# Patient Record
Sex: Female | Born: 1965 | Race: Black or African American | Hispanic: No | Smoking: Never smoker
Health system: Southern US, Community
[De-identification: ages and names within clinical notes are randomized; demographics above are authoritative.]

## PROBLEM LIST (undated history)

## (undated) DIAGNOSIS — R7303 Prediabetes: Secondary | ICD-10-CM

## (undated) DIAGNOSIS — M1990 Unspecified osteoarthritis, unspecified site: Secondary | ICD-10-CM

## (undated) DIAGNOSIS — I10 Essential (primary) hypertension: Secondary | ICD-10-CM

## (undated) HISTORY — PX: AUGMENTATION MAMMAPLASTY: SUR837

---

## 2004-07-31 ENCOUNTER — Emergency Department (HOSPITAL_COMMUNITY): Admission: EM | Admit: 2004-07-31 | Discharge: 2004-07-31 | Payer: Self-pay | Admitting: Emergency Medicine

## 2010-02-06 ENCOUNTER — Emergency Department (HOSPITAL_COMMUNITY): Admission: EM | Admit: 2010-02-06 | Discharge: 2010-02-06 | Payer: Self-pay | Admitting: Emergency Medicine

## 2010-05-19 ENCOUNTER — Other Ambulatory Visit: Payer: Self-pay | Admitting: Obstetrics and Gynecology

## 2010-05-19 DIAGNOSIS — Z139 Encounter for screening, unspecified: Secondary | ICD-10-CM

## 2010-06-16 ENCOUNTER — Ambulatory Visit (HOSPITAL_BASED_OUTPATIENT_CLINIC_OR_DEPARTMENT_OTHER)
Admission: RE | Admit: 2010-06-16 | Discharge: 2010-06-16 | Disposition: A | Discharge status: Home / Self Care | Payer: 59 | Source: Ambulatory Visit | Attending: Obstetrics and Gynecology | Admitting: Obstetrics and Gynecology

## 2010-06-16 DIAGNOSIS — Z139 Encounter for screening, unspecified: Secondary | ICD-10-CM

## 2010-06-16 DIAGNOSIS — Z1231 Encounter for screening mammogram for malignant neoplasm of breast: Secondary | ICD-10-CM | POA: Insufficient documentation

## 2011-06-08 ENCOUNTER — Other Ambulatory Visit: Payer: Self-pay | Admitting: Obstetrics and Gynecology

## 2011-06-08 DIAGNOSIS — Z1231 Encounter for screening mammogram for malignant neoplasm of breast: Secondary | ICD-10-CM

## 2011-06-22 ENCOUNTER — Ambulatory Visit: Payer: 59

## 2011-07-18 ENCOUNTER — Ambulatory Visit: Payer: 59

## 2012-02-14 ENCOUNTER — Other Ambulatory Visit: Payer: Self-pay | Admitting: Family Medicine

## 2012-02-14 NOTE — Telephone Encounter (Signed)
Chart pulled to PA pool at nurses station DOS 03/02/11

## 2012-02-21 ENCOUNTER — Other Ambulatory Visit: Payer: Self-pay | Admitting: *Deleted

## 2012-02-24 ENCOUNTER — Other Ambulatory Visit: Payer: Self-pay | Admitting: *Deleted

## 2012-02-27 ENCOUNTER — Other Ambulatory Visit: Payer: Self-pay | Admitting: Radiology

## 2012-06-25 ENCOUNTER — Ambulatory Visit (HOSPITAL_BASED_OUTPATIENT_CLINIC_OR_DEPARTMENT_OTHER): Payer: 59

## 2012-07-02 ENCOUNTER — Ambulatory Visit (HOSPITAL_BASED_OUTPATIENT_CLINIC_OR_DEPARTMENT_OTHER)
Admission: RE | Admit: 2012-07-02 | Discharge: 2012-07-02 | Disposition: A | Discharge status: Home / Self Care | Payer: 59 | Source: Ambulatory Visit | Attending: Obstetrics and Gynecology | Admitting: Obstetrics and Gynecology

## 2012-07-02 DIAGNOSIS — Z1231 Encounter for screening mammogram for malignant neoplasm of breast: Secondary | ICD-10-CM | POA: Insufficient documentation

## 2013-11-13 ENCOUNTER — Other Ambulatory Visit: Payer: Self-pay | Admitting: Physician Assistant

## 2013-11-13 ENCOUNTER — Other Ambulatory Visit (HOSPITAL_BASED_OUTPATIENT_CLINIC_OR_DEPARTMENT_OTHER): Payer: Self-pay | Admitting: Internal Medicine

## 2013-11-13 DIAGNOSIS — Z1231 Encounter for screening mammogram for malignant neoplasm of breast: Secondary | ICD-10-CM

## 2013-11-14 ENCOUNTER — Emergency Department (HOSPITAL_COMMUNITY)
Admission: EM | Admit: 2013-11-14 | Discharge: 2013-11-14 | Disposition: A | Discharge status: Home / Self Care | Payer: 59 | Attending: Emergency Medicine | Admitting: Emergency Medicine

## 2013-11-14 ENCOUNTER — Emergency Department (HOSPITAL_COMMUNITY): Payer: 59

## 2013-11-14 ENCOUNTER — Encounter (HOSPITAL_COMMUNITY): Payer: Self-pay | Admitting: Emergency Medicine

## 2013-11-14 DIAGNOSIS — Z8739 Personal history of other diseases of the musculoskeletal system and connective tissue: Secondary | ICD-10-CM | POA: Diagnosis not present

## 2013-11-14 DIAGNOSIS — S60229A Contusion of unspecified hand, initial encounter: Secondary | ICD-10-CM | POA: Diagnosis not present

## 2013-11-14 DIAGNOSIS — S8000XA Contusion of unspecified knee, initial encounter: Secondary | ICD-10-CM | POA: Diagnosis not present

## 2013-11-14 DIAGNOSIS — I498 Other specified cardiac arrhythmias: Secondary | ICD-10-CM | POA: Diagnosis not present

## 2013-11-14 DIAGNOSIS — S8990XA Unspecified injury of unspecified lower leg, initial encounter: Secondary | ICD-10-CM | POA: Diagnosis present

## 2013-11-14 DIAGNOSIS — S8001XA Contusion of right knee, initial encounter: Secondary | ICD-10-CM

## 2013-11-14 DIAGNOSIS — S99929A Unspecified injury of unspecified foot, initial encounter: Secondary | ICD-10-CM

## 2013-11-14 DIAGNOSIS — Y9389 Activity, other specified: Secondary | ICD-10-CM | POA: Diagnosis not present

## 2013-11-14 DIAGNOSIS — S60221A Contusion of right hand, initial encounter: Secondary | ICD-10-CM

## 2013-11-14 DIAGNOSIS — Y929 Unspecified place or not applicable: Secondary | ICD-10-CM | POA: Diagnosis not present

## 2013-11-14 DIAGNOSIS — S99919A Unspecified injury of unspecified ankle, initial encounter: Secondary | ICD-10-CM | POA: Diagnosis present

## 2013-11-14 DIAGNOSIS — S8002XA Contusion of left knee, initial encounter: Secondary | ICD-10-CM

## 2013-11-14 HISTORY — DX: Unspecified osteoarthritis, unspecified site: M19.90

## 2013-11-14 MED ORDER — TRAMADOL HCL 50 MG PO TABS: 50.0000 mg | ORAL_TABLET | Freq: Four times a day (QID) | ORAL | Status: DC | PRN

## 2013-11-14 MED ORDER — IBUPROFEN 800 MG PO TABS
800.0000 mg | ORAL_TABLET | Freq: Once | ORAL | Status: AC
  Administered 2013-11-14: 800 mg via ORAL
  Filled 2013-11-14: qty 1

## 2013-11-14 MED ORDER — OXYCODONE-ACETAMINOPHEN 5-325 MG PO TABS
1.0000 | ORAL_TABLET | Freq: Once | ORAL | Status: DC
  Filled 2013-11-14: qty 1

## 2013-11-14 MED ORDER — IBUPROFEN 800 MG PO TABS: 800.0000 mg | ORAL_TABLET | Freq: Three times a day (TID) | ORAL | Status: DC

## 2013-11-14 MED ORDER — CYCLOBENZAPRINE HCL 10 MG PO TABS: 10.0000 mg | ORAL_TABLET | Freq: Every day | ORAL | Status: DC
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
## 2013-11-14 NOTE — Discharge Instructions (Signed)
Call for a follow up appointment with a Family or Primary Care Provider.  Return if Symptoms worsen.   Take medication as prescribed.  Ice your knees and hand 3-4 times a day. Do not operate heavy machinery or drink alcohol while taking Ultram or Flexeril.
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
## 2013-11-19 NOTE — ED Provider Notes (Addendum)
Medical screening examination/treatment/procedure(s) were performed by non-physician practitioner and as supervising physician I was immediately available for consultation/collaboration.   EKG Interpretation None         EKG Interpretation None        Rolland PorterMark Davarion Cuffee, MD 11/19/13 [id number]  Rolland PorterMark Keanu Frickey, MD 12/01/13 [phone]

## 2013-11-23 ENCOUNTER — Ambulatory Visit (HOSPITAL_BASED_OUTPATIENT_CLINIC_OR_DEPARTMENT_OTHER)
Admission: RE | Admit: 2013-11-23 | Discharge: 2013-11-23 | Disposition: A | Discharge status: Home / Self Care | Payer: 59 | Source: Ambulatory Visit | Attending: Internal Medicine | Admitting: Internal Medicine

## 2013-11-23 DIAGNOSIS — Z1231 Encounter for screening mammogram for malignant neoplasm of breast: Secondary | ICD-10-CM | POA: Diagnosis present

## 2014-12-28 ENCOUNTER — Other Ambulatory Visit (HOSPITAL_BASED_OUTPATIENT_CLINIC_OR_DEPARTMENT_OTHER): Payer: Self-pay | Admitting: Obstetrics and Gynecology

## 2014-12-28 DIAGNOSIS — Z1231 Encounter for screening mammogram for malignant neoplasm of breast: Secondary | ICD-10-CM

## 2015-01-14 ENCOUNTER — Ambulatory Visit (HOSPITAL_BASED_OUTPATIENT_CLINIC_OR_DEPARTMENT_OTHER)
Admission: RE | Admit: 2015-01-14 | Discharge: 2015-01-14 | Disposition: A | Discharge status: Home / Self Care | Payer: 59 | Source: Ambulatory Visit | Attending: Obstetrics and Gynecology | Admitting: Obstetrics and Gynecology

## 2015-01-14 DIAGNOSIS — Z1231 Encounter for screening mammogram for malignant neoplasm of breast: Secondary | ICD-10-CM | POA: Diagnosis not present

## 2016-03-02 ENCOUNTER — Other Ambulatory Visit (HOSPITAL_BASED_OUTPATIENT_CLINIC_OR_DEPARTMENT_OTHER): Payer: Self-pay | Admitting: Internal Medicine

## 2016-03-02 DIAGNOSIS — Z1231 Encounter for screening mammogram for malignant neoplasm of breast: Secondary | ICD-10-CM

## 2016-03-03 ENCOUNTER — Other Ambulatory Visit (HOSPITAL_BASED_OUTPATIENT_CLINIC_OR_DEPARTMENT_OTHER): Payer: Self-pay | Admitting: Internal Medicine

## 2016-03-03 ENCOUNTER — Ambulatory Visit (HOSPITAL_BASED_OUTPATIENT_CLINIC_OR_DEPARTMENT_OTHER)
Admission: RE | Admit: 2016-03-03 | Discharge: 2016-03-03 | Disposition: A | Discharge status: Home / Self Care | Payer: 59 | Source: Ambulatory Visit | Attending: Internal Medicine | Admitting: Internal Medicine

## 2016-03-03 DIAGNOSIS — Z1231 Encounter for screening mammogram for malignant neoplasm of breast: Secondary | ICD-10-CM

## 2018-05-02 LAB — HM PAP SMEAR: HM Pap smear: ABNORMAL

## 2018-05-05 ENCOUNTER — Other Ambulatory Visit: Payer: Self-pay

## 2018-05-05 MED ORDER — TRIAMTERENE-HCTZ 37.5-25 MG PO TABS: 1.0000 | ORAL_TABLET | Freq: Every day | ORAL | 0 refills | Status: DC

## 2018-05-07 ENCOUNTER — Ambulatory Visit: Payer: Self-pay | Admitting: Nurse Practitioner

## 2018-06-01 ENCOUNTER — Other Ambulatory Visit: Payer: Self-pay | Admitting: Internal Medicine

## 2018-06-03 ENCOUNTER — Encounter: Payer: Self-pay | Admitting: Nurse Practitioner

## 2018-06-25 ENCOUNTER — Other Ambulatory Visit: Payer: Self-pay | Admitting: Internal Medicine

## 2018-06-30 ENCOUNTER — Other Ambulatory Visit: Payer: Self-pay

## 2018-06-30 ENCOUNTER — Other Ambulatory Visit: Payer: Self-pay | Admitting: Nurse Practitioner

## 2018-06-30 MED ORDER — TRIAMTERENE-HCTZ 37.5-25 MG PO TABS: 1.0000 | ORAL_TABLET | Freq: Every day | ORAL | 0 refills | Status: DC

## 2018-07-03 ENCOUNTER — Encounter: Payer: Self-pay | Admitting: Internal Medicine

## 2018-07-11 ENCOUNTER — Ambulatory Visit: Payer: 59 | Admitting: Nurse Practitioner

## 2018-07-11 ENCOUNTER — Encounter: Payer: Self-pay | Admitting: Nurse Practitioner

## 2018-07-11 ENCOUNTER — Other Ambulatory Visit: Payer: Self-pay

## 2018-07-11 VITALS — BP 132/76 | HR 89 | Temp 97.7°F | Ht 62.6 in | Wt 188.0 lb

## 2018-07-11 DIAGNOSIS — G4700 Insomnia, unspecified: Secondary | ICD-10-CM | POA: Diagnosis not present

## 2018-07-11 DIAGNOSIS — I10 Essential (primary) hypertension: Secondary | ICD-10-CM

## 2018-07-11 DIAGNOSIS — R82998 Other abnormal findings in urine: Secondary | ICD-10-CM

## 2018-07-11 DIAGNOSIS — Z6833 Body mass index (BMI) 33.0-33.9, adult: Secondary | ICD-10-CM

## 2018-07-11 DIAGNOSIS — M25562 Pain in left knee: Secondary | ICD-10-CM

## 2018-07-11 DIAGNOSIS — E6609 Other obesity due to excess calories: Secondary | ICD-10-CM

## 2018-07-11 MED ORDER — TRIAMCINOLONE ACETONIDE 40 MG/ML IJ SUSP
40.0000 mg | Freq: Once | INTRAMUSCULAR | Status: AC
  Administered 2018-07-11: 40 mg via INTRAMUSCULAR

## 2018-07-11 MED ORDER — PHENTERMINE HCL 37.5 MG PO CAPS
37.5000 mg | ORAL_CAPSULE | ORAL | Status: AC
Start: 2018-07-11 — End: ?

## 2018-07-11 MED ORDER — SERTRALINE HCL 25 MG PO TABS: 25.0000 mg | ORAL_TABLET | Freq: Every day | ORAL | 2 refills | Status: AC

## 2018-07-11 MED ORDER — TRAZODONE HCL 50 MG PO TABS: 50.0000 mg | ORAL_TABLET | Freq: Every day | ORAL | 1 refills | Status: DC

## 2018-07-11 NOTE — Progress Notes (Addendum)
Subjective:     Patient ID: Olivia Pierce , female    DOB: 1965-12-29 , 53 y.o.   MRN: [id number]   Chief Complaint  Patient presents with  . Knee Pain    left knee- low creatine wants to see liver specialist    HPI  She had been having swelling to her left knee since having swelling while in [place].  No exposure to COVID 19, she returned from [place] on this Sunday.  No fever, cough or shortness of breath.   Knee Pain   The incident occurred more than 1 week ago. There was no injury mechanism. The pain is present in the left knee. The pain has been intermittent since onset. Pertinent negatives include no numbness or tingling. Exacerbated by: walking and worse at night. She has tried NSAIDs for the symptoms.  Hypertension  This is a chronic problem. The current episode started more than 1 year ago. The problem is unchanged. The problem is controlled. Pertinent negatives include no anxiety, chest pain or palpitations.     Past Medical History:  Diagnosis Date  . Arthritis      Family History  Problem Relation Age of Onset  . Diabetes Mother   . Hypertension Mother   . Stroke Mother   . Heart disease Mother   . Heart disease Maternal Grandmother   . Stroke Maternal Grandmother   . Diabetes Maternal Grandmother      Current Outpatient Medications:  .  triamterene-hydrochlorothiazide (MAXZIDE-25) 37.5-25 MG tablet, TAKE 1 TABLET BY MOUTH DAILY, Disp: 90 tablet, Rfl: 0 .  celecoxib (CELEBREX) 200 MG capsule, Take 200 mg by mouth as needed for mild pain. , Disp: , Rfl:  .  cyclobenzaprine (FLEXERIL) 10 MG tablet, Take 1 tablet (10 mg total) by mouth at bedtime. (Patient not taking: Reported on 07/11/2018), Disp: 5 tablet, Rfl: 0 .  ibuprofen (ADVIL,MOTRIN) 800 MG tablet, Take 1 tablet (800 mg total) by mouth 3 (three) times daily. (Patient not taking: Reported on 07/11/2018), Disp: 21 tablet, Rfl: 0 .  traMADol (ULTRAM) 50 MG tablet, Take 1 tablet (50 mg total) by mouth every 6  (six) hours as needed. (Patient not taking: Reported on 07/11/2018), Disp: 10 tablet, Rfl: 0   No Known Allergies   Review of Systems  Constitutional: Negative for fatigue.  Respiratory: Negative for cough.   Cardiovascular: Negative.  Negative for chest pain, palpitations and leg swelling.  Endocrine: Negative for polydipsia, polyphagia and polyuria.  Neurological: Negative for tingling and numbness.     Today's Vitals   07/11/18 1545  BP: 132/76  Pulse: 89  Temp: 97.7 F (36.5 C)  TempSrc: Oral  SpO2: 97%  Weight: 188 lb (85.3 kg)  Height: 5' 2.6" (1.59 m)   Body mass index is 33.73 kg/m.   Objective:  Physical Exam Constitutional:      Appearance: Normal appearance.  Cardiovascular:     Rate and Rhythm: Normal rate and regular rhythm.     Pulses: Normal pulses.     Heart sounds: Normal heart sounds. No murmur.  Pulmonary:     Effort: Pulmonary effort is normal. No respiratory distress.     Breath sounds: Normal breath sounds. No wheezing.  Musculoskeletal:        General: Tenderness (left anterior knee, mild crepitus ) present.  Skin:    General: Skin is warm and dry.     Capillary Refill: Capillary refill takes less than 2 seconds.  Neurological:     General: No  focal deficit present.     Mental Status: She is alert and oriented to person, place, and time.  Psychiatric:        Mood and Affect: Mood normal.        Behavior: Behavior normal.        Thought Content: Thought content normal.        Judgment: Judgment normal.         Assessment And Plan:     1. Acute pain of left knee  Tenderness to anterior knee with mild crepitus  Will send Rx for  - triamcinolone acetonide (KENALOG-40) injection 40 mg - traZODone (DESYREL) 50 MG tablet; Take 1 tablet (50 mg total) by mouth at bedtime.  Dispense: 30 tablet; Refill: 1  2. Insomnia, unspecified type  Has been ongoing since January she has previously been on [place] several years ago  Will start on  trazodone nightly as needed and return in 6 weeks for follow up - traZODone (DESYREL) 50 MG tablet; Take 1 tablet (50 mg total) by mouth at bedtime.  Dispense: 30 tablet; Refill: 1  3. Class 1 obesity due to excess calories without serious comorbidity with body mass index (BMI) of 33.0 to 33.9 in adult  She is being followed by a weight management provider.   - phentermine 37.5 MG capsule; Take 1 capsule (37.5 mg total) by mouth every morning. - sertraline (ZOLOFT) 25 MG tablet; Take 1 tablet (25 mg total) by mouth daily.  Dispense: 30 tablet; Refill: 2  4. Hypertension, unspecified type . B/P is fair control.  . CMP ordered to check renal function.  . The importance of regular exercise and dietary modification was stressed to the patient.  - POCT Urinalysis Dipstick (81002) - POCT UA - Microalbumin  5. Urine white blood cells increased  Moderate white cells in urine will send urine for culture - Culture, Urine - Urine cytology ancillary only  Arnette Felts, FNP

## 2018-07-14 LAB — POCT UA - MICROALBUMIN
Albumin/Creatinine Ratio, Urine, POC: <30
Creatinine, POC: 200 mg/dL
Microalbumin Ur, POC: 10 mg/L

## 2018-07-14 LAB — POCT URINALYSIS DIPSTICK
Bilirubin, UA: NEGATIVE
Glucose, UA: NEGATIVE
Ketones, UA: NEGATIVE
Nitrite, UA: NEGATIVE
PH UA: 6.5 (ref 5.0–8.0)
PROTEIN UA: NEGATIVE
Spec Grav, UA: 1.02 (ref 1.010–1.025)
Urobilinogen, UA: 0.2 E.U./dL

## 2018-07-14 NOTE — Addendum Note (Signed)
Addended by: Nelda Bucks on: 07/14/2018 08:46 AM   Modules accepted: Orders

## 2018-07-14 NOTE — Addendum Note (Signed)
Addended by: Arnette Felts F on: 07/14/2018 10:04 AM   Modules accepted: Orders

## 2018-07-15 LAB — URINE CULTURE

## 2018-07-16 DIAGNOSIS — R82998 Other abnormal findings in urine: Secondary | ICD-10-CM | POA: Insufficient documentation

## 2018-07-16 DIAGNOSIS — I10 Essential (primary) hypertension: Secondary | ICD-10-CM | POA: Insufficient documentation

## 2018-07-18 ENCOUNTER — Telehealth: Payer: Self-pay

## 2018-07-18 NOTE — Telephone Encounter (Signed)
1st attempt to give lab results  

## 2018-09-04 ENCOUNTER — Other Ambulatory Visit: Payer: Self-pay | Admitting: Nurse Practitioner

## 2018-09-04 DIAGNOSIS — G4700 Insomnia, unspecified: Secondary | ICD-10-CM

## 2018-09-04 DIAGNOSIS — M25562 Pain in left knee: Secondary | ICD-10-CM

## 2018-09-04 NOTE — Telephone Encounter (Signed)
Trazodone refill 

## 2018-09-27 ENCOUNTER — Other Ambulatory Visit: Payer: Self-pay | Admitting: Nurse Practitioner

## 2019-01-01 ENCOUNTER — Other Ambulatory Visit: Payer: Self-pay | Admitting: Nurse Practitioner

## 2019-01-08 ENCOUNTER — Telehealth: Payer: Self-pay

## 2019-01-08 NOTE — Telephone Encounter (Signed)
Pt called for a refill of bp meds. Pt wanted to know why her request was denied. Pt has not been seen since 07/11/18 and that appt was for knee pain but her htn was addressed. Pt was told that she needed to be seen every 3-4 months for htn. I attempted to make an appt but pt refused and stated that she would call back at a later time. Before the 07/11/18 appt patient was seen on 09/20/2016 for a physical.

## 2019-02-11 ENCOUNTER — Other Ambulatory Visit (HOSPITAL_BASED_OUTPATIENT_CLINIC_OR_DEPARTMENT_OTHER): Payer: Self-pay | Admitting: Cardiology

## 2019-02-11 DIAGNOSIS — Z1231 Encounter for screening mammogram for malignant neoplasm of breast: Secondary | ICD-10-CM

## 2019-02-13 ENCOUNTER — Ambulatory Visit (HOSPITAL_BASED_OUTPATIENT_CLINIC_OR_DEPARTMENT_OTHER): Payer: 59

## 2019-02-27 ENCOUNTER — Ambulatory Visit (HOSPITAL_BASED_OUTPATIENT_CLINIC_OR_DEPARTMENT_OTHER)
Admission: RE | Admit: 2019-02-27 | Discharge: 2019-02-27 | Disposition: A | Discharge status: Home / Self Care | Payer: 59 | Source: Ambulatory Visit | Attending: Cardiology | Admitting: Cardiology

## 2019-02-27 ENCOUNTER — Other Ambulatory Visit: Payer: Self-pay

## 2019-02-27 ENCOUNTER — Encounter (HOSPITAL_BASED_OUTPATIENT_CLINIC_OR_DEPARTMENT_OTHER): Payer: Self-pay

## 2019-02-27 DIAGNOSIS — Z1231 Encounter for screening mammogram for malignant neoplasm of breast: Secondary | ICD-10-CM | POA: Insufficient documentation

## 2019-05-15 ENCOUNTER — Other Ambulatory Visit: Payer: Self-pay | Admitting: Urology

## 2019-05-15 ENCOUNTER — Other Ambulatory Visit: Payer: Self-pay | Admitting: Physician Assistant

## 2019-05-15 DIAGNOSIS — R519 Headache, unspecified: Secondary | ICD-10-CM

## 2019-05-18 ENCOUNTER — Ambulatory Visit
Admission: RE | Admit: 2019-05-18 | Discharge: 2019-05-18 | Disposition: A | Discharge status: Home / Self Care | Payer: No Typology Code available for payment source | Source: Ambulatory Visit | Attending: Physician Assistant | Admitting: Physician Assistant

## 2019-05-18 DIAGNOSIS — R519 Headache, unspecified: Secondary | ICD-10-CM

## 2020-01-22 ENCOUNTER — Other Ambulatory Visit: Payer: Self-pay

## 2020-01-22 ENCOUNTER — Ambulatory Visit (INDEPENDENT_AMBULATORY_CARE_PROVIDER_SITE_OTHER): Payer: No Typology Code available for payment source

## 2020-01-22 DIAGNOSIS — Z23 Encounter for immunization: Secondary | ICD-10-CM | POA: Diagnosis not present

## 2020-01-25 NOTE — Progress Notes (Signed)
   Covid-19 Vaccination Clinic  Name:  Tresea Heine    MRN: [id number] DOB: 08/15/1965  01/25/2020  Ms. Chui was observed post Covid-19 immunization for 15 minutes without incident. She was provided with Vaccine Information Sheet and instruction to access the V-Safe system.   Ms. Harriss was instructed to call 911 with any severe reactions post vaccine: Marland Kitchen Difficulty breathing  . Swelling of face and throat  . A fast heartbeat  . A bad rash all over body  . Dizziness and weakness

## 2020-02-22 ENCOUNTER — Other Ambulatory Visit: Payer: Self-pay | Admitting: Obstetrics and Gynecology

## 2020-02-22 DIAGNOSIS — Z1231 Encounter for screening mammogram for malignant neoplasm of breast: Secondary | ICD-10-CM

## 2020-03-31 ENCOUNTER — Ambulatory Visit
Admission: RE | Admit: 2020-03-31 | Discharge: 2020-03-31 | Disposition: A | Discharge status: Home / Self Care | Payer: No Typology Code available for payment source | Source: Ambulatory Visit | Attending: Obstetrics and Gynecology | Admitting: Obstetrics and Gynecology

## 2020-03-31 ENCOUNTER — Other Ambulatory Visit: Payer: Self-pay

## 2020-03-31 DIAGNOSIS — Z1231 Encounter for screening mammogram for malignant neoplasm of breast: Secondary | ICD-10-CM

## 2020-04-26 ENCOUNTER — Telehealth (INDEPENDENT_AMBULATORY_CARE_PROVIDER_SITE_OTHER): Payer: Managed Care, Other (non HMO) | Admitting: Allergy & Immunology

## 2020-04-26 DIAGNOSIS — J029 Acute pharyngitis, unspecified: Secondary | ICD-10-CM

## 2020-04-26 DIAGNOSIS — Z1152 Encounter for screening for COVID-19: Secondary | ICD-10-CM

## 2020-04-26 NOTE — Telephone Encounter (Signed)
Patient reports that she has a sore throat. Strep swab ordered. Rapid COVID done and was negative. COVID PCR ordered as well. Patient in agreement with the plan.  Malachi Bonds, MD [facility]

## 2020-04-27 MED ORDER — PREDNISONE 10 MG PO TABS: ORAL_TABLET | ORAL | 0 refills | Status: DC

## 2020-04-27 NOTE — Telephone Encounter (Signed)
Patient called and she was feeling worse. Strep and COVID pending. Reminder that Rapid COVID was negative yesterday.   Symptoms have been ongoing for two days. I sent in a prednisone course to get her feeling better and help with the nasal congestion.    Olivia Bonds, MD [facility]

## 2020-04-27 NOTE — Addendum Note (Signed)
Addended by: Alfonse Spruce on: 04/27/2020 04:56 PM   Modules accepted: Orders

## 2020-04-28 LAB — CULTURE, GROUP A STREP: Strep A Culture: NEGATIVE

## 2020-04-29 LAB — NOVEL CORONAVIRUS, NAA: SARS-CoV-2, NAA: NOT DETECTED

## 2020-09-16 ENCOUNTER — Ambulatory Visit (INDEPENDENT_AMBULATORY_CARE_PROVIDER_SITE_OTHER): Payer: Managed Care, Other (non HMO)

## 2020-09-16 DIAGNOSIS — Z23 Encounter for immunization: Secondary | ICD-10-CM

## 2020-09-16 NOTE — Progress Notes (Signed)
   Covid-19 Vaccination Clinic  Name:  Olivia Pierce    MRN: [id number] DOB: 1965-11-23  09/16/2020  [name] was observed post Covid-19 immunization for 15 minutes without incident. She was provided with Vaccine Information Sheet and instruction to access the V-Safe system.   [name] was instructed to call 911 with any severe reactions post vaccine: Marland Kitchen Difficulty breathing  . Swelling of face and throat  . A fast heartbeat  . A bad rash all over body  . Dizziness and weakness

## 2021-05-11 ENCOUNTER — Encounter (HOSPITAL_COMMUNITY): Payer: Self-pay | Admitting: Emergency Medicine

## 2021-05-11 ENCOUNTER — Ambulatory Visit (INDEPENDENT_AMBULATORY_CARE_PROVIDER_SITE_OTHER): Payer: Managed Care, Other (non HMO)

## 2021-05-11 ENCOUNTER — Other Ambulatory Visit: Payer: Self-pay

## 2021-05-11 ENCOUNTER — Ambulatory Visit (HOSPITAL_COMMUNITY)
Admission: EM | Admit: 2021-05-11 | Discharge: 2021-05-11 | Disposition: A | Discharge status: Home / Self Care | Payer: Managed Care, Other (non HMO) | Attending: Emergency Medicine | Admitting: Emergency Medicine

## 2021-05-11 DIAGNOSIS — S8265XA Nondisplaced fracture of lateral malleolus of left fibula, initial encounter for closed fracture: Secondary | ICD-10-CM | POA: Diagnosis not present

## 2021-05-11 HISTORY — DX: Essential (primary) hypertension: I10

## 2021-05-11 MED ORDER — TRAMADOL HCL 50 MG PO TABS: 50.0000 mg | ORAL_TABLET | Freq: Four times a day (QID) | ORAL | 0 refills | Status: DC | PRN

## 2021-05-11 NOTE — ED Triage Notes (Signed)
Pt reports when she was coming out of a patient's room, her left ankle rolled/twist on her causing her to fall. Pt c/o left foot and ankle pains that radiates up left leg. Reports she did hit her head on bottom of wall but denies LOC or taking blood thinners.

## 2021-05-11 NOTE — Discharge Instructions (Addendum)
Follow up with Sports Medicine. Call them today to set up your appointment.   Ice, elevate your injured ankle. It is ok to walk on it unless it hurts too much to do so.   Use over the counter ibuprofen - take 2 pills (400mg ) 3 times a day

## 2021-05-11 NOTE — Progress Notes (Deleted)
° °   Benito Mccreedy D.[place] Phone: [phone]   Assessment and Plan:     There are no diagnoses linked to this encounter.  ***   Pertinent previous records reviewed include ***   Follow Up: ***     Subjective:   I, Francois Elk, am serving as a Education administrator for Doctor Glennon Mac  Chief Complaint: left ankle chipped bone   HPI:   05/12/2021  Patient is a 56 year old female complaining of ankle pain. Patient states she was coming out of a patient's room, her left ankle rolled/twist on her causing her to fall. Pt c/o left foot and ankle pains that radiates up left leg  Relevant Historical Information: ***  Additional pertinent review of systems negative.   Current Outpatient Medications:    phentermine 37.5 MG capsule, Take 1 capsule (37.5 mg total) by mouth every morning., Disp: , Rfl:    predniSONE (DELTASONE) 10 MG tablet, Take 3 tabs (30mg ) twice daily for 3 days, then 2 tabs (20mg ) twice daily for 3 days, then 1 tab (10mg ) twice daily for 3 days, then STOP., Disp: 36 tablet, Rfl: 0   sertraline (ZOLOFT) 25 MG tablet, Take 1 tablet (25 mg total) by mouth daily., Disp: 30 tablet, Rfl: 2   traMADol (ULTRAM) 50 MG tablet, Take 1 tablet (50 mg total) by mouth every 6 (six) hours as needed., Disp: 15 tablet, Rfl: 0   traZODone (DESYREL) 50 MG tablet, Take 1 tablet (50 mg total) by mouth at bedtime., Disp: 30 tablet, Rfl: 1   triamterene-hydrochlorothiazide (MAXZIDE-25) 37.5-25 MG tablet, TAKE 1 TABLET BY MOUTH DAILY, Disp: 90 tablet, Rfl: 0   Objective:     There were no vitals filed for this visit.    There is no height or weight on file to calculate BMI.    Physical Exam:    ***   Electronically signed by:  Benito Mccreedy D.[facility] 3:57 PM 05/11/21

## 2021-05-11 NOTE — ED Provider Notes (Signed)
[facility]    CSN: [id number] Arrival date & time: 05/11/21  1344      History   Chief Complaint Chief Complaint  Patient presents with   Ankle Pain   Leg Pain    HPI Olivia Pierce is a 56 y.o. female. Pt rolled L ankle today when walking at work, fell and did hit head on bottom of wall when she fell but denies pain in head, confusion, dizziness.    Ankle Pain Leg Pain  Past Medical History:  Diagnosis Date   Arthritis    Hypertension     Patient Active Problem List   Diagnosis Date Noted   Hypertension 07/16/2018   Urine white blood cells increased 07/16/2018   Acute pain of left knee 07/11/2018   Insomnia 07/11/2018   Class 1 obesity due to excess calories without serious comorbidity with body mass index (BMI) of 33.0 to 33.9 in adult 07/11/2018    Past Surgical History:  Procedure Laterality Date   AUGMENTATION MAMMAPLASTY     late 73s early 30s did not remember when implants were placed     OB History   No obstetric history on file.      Home Medications    Prior to Admission medications   Medication Sig Start Date End Date Taking? Authorizing Provider  phentermine 37.5 MG capsule Take 1 capsule (37.5 mg total) by mouth every morning. 07/11/18   Arnette Felts, FNP  predniSONE (DELTASONE) 10 MG tablet Take 3 tabs (30mg ) twice daily for 3 days, then 2 tabs (20mg ) twice daily for 3 days, then 1 tab (10mg ) twice daily for 3 days, then STOP. 04/27/20   , MD  sertraline (ZOLOFT) 25 MG tablet Take 1 tablet (25 mg total) by mouth daily. 07/11/18 07/11/19  Alfonse Spruce, FNP  traMADol (ULTRAM) 50 MG tablet Take 1 tablet (50 mg total) by mouth every 6 (six) hours as needed. 05/15/21   07/13/19, MD  traZODone (DESYREL) 50 MG tablet Take 1 tablet (50 mg total) by mouth at bedtime. 07/11/18   05/17/21, FNP  triamterene-hydrochlorothiazide (MAXZIDE-25) 37.5-25 MG tablet TAKE 1 TABLET BY MOUTH DAILY 09/29/18   Arnette Felts,  FNP    Family History Family History  Problem Relation Age of Onset   Diabetes Mother    Hypertension Mother    Stroke Mother    Heart disease Mother    Heart disease Maternal Grandmother    Stroke Maternal Grandmother    Diabetes Maternal Grandmother     Social History Social History   Tobacco Use   Smoking status: Never   Smokeless tobacco: Never  Substance Use Topics   Alcohol use: Yes    Comment: socially but rarely   Drug use: No     Allergies   Patient has no known allergies.   Review of Systems Review of Systems  Musculoskeletal:  Positive for joint swelling.  Skin:  Negative for wound.  Neurological:  Negative for dizziness and headaches.    Physical Exam Triage Vital Signs ED Triage Vitals  Enc Vitals Group     BP 05/11/21 1400 137/89     Pulse Rate 05/11/21 1400 60     Resp 05/11/21 1400 18     Temp 05/11/21 1400 98.4 F (36.9 C)     Temp Source 05/11/21 1400 Oral     SpO2 05/11/21 1400 100 %     Weight --      Height --  Head Circumference --      Peak Flow --      Pain Score 05/11/21 1359 4     Pain Loc --      Pain Edu? --      Excl. in GC? --    No data found.  Updated Vital Signs BP 137/89 (BP Location: Right Arm)    Pulse 60    Temp 98.4 F (36.9 C) (Oral)    Resp 18    SpO2 100%   Visual Acuity Right Eye Distance:   Left Eye Distance:   Bilateral Distance:    Right Eye Near:   Left Eye Near:    Bilateral Near:     Physical Exam Constitutional:      Appearance: Normal appearance.  HENT:     Head: Normocephalic and atraumatic.  Pulmonary:     Effort: Pulmonary effort is normal.  Musculoskeletal:     Left ankle: Swelling present. Tenderness present. Decreased range of motion. Normal pulse.  Neurological:     Mental Status: She is alert.     UC Treatments / Results  Labs (all labs ordered are listed, but only abnormal results are displayed) Labs Reviewed - No data to display  EKG   Radiology No results  found.  Procedures Procedures (including critical care time)  Medications Ordered in UC Medications - No data to display  Initial Impression / Assessment and Plan / UC Course  I have reviewed the triage vital signs and the nursing notes.  Pertinent labs & imaging results that were available during my care of the patient were reviewed by me and considered in my medical decision making (see chart for details).    Pt placed in cam walker. Unable to bear weight due to pain; given crutches. Pt to f/u with sports medicine; call today for appt. Pt has been told not to take NSAIDs due to kidney function and that once she stopped taking NSAIDs, kidney function returned to normal. Discussed taking low dose ibuprofen for a few days to help manage pain and swelling and then to stop using.   Final Clinical Impressions(s) / UC Diagnoses   Final diagnoses:  Closed nondisplaced fracture of lateral malleolus of left fibula, initial encounter     Discharge Instructions      Follow up with Sports Medicine. Call them today to set up your appointment.   Ice, elevate your injured ankle. It is ok to walk on it unless it hurts too much to do so.   Use over the counter ibuprofen - take 2 pills (400mg ) 3 times a day     ED Prescriptions     Medication Sig Dispense Auth. Provider   traMADol (ULTRAM) 50 MG tablet Take 1 tablet (50 mg total) by mouth every 6 (six) hours as needed. 15 tablet , NP      I have reviewed the PDMP during this encounter.   Cathlyn Parsons, NP 05/17/21 [phone]

## 2021-05-12 ENCOUNTER — Other Ambulatory Visit: Payer: Self-pay

## 2021-05-12 ENCOUNTER — Ambulatory Visit: Payer: Self-pay | Admitting: Sports Medicine

## 2021-05-15 ENCOUNTER — Telehealth: Payer: Self-pay | Admitting: Emergency Medicine

## 2021-05-15 ENCOUNTER — Telehealth (HOSPITAL_COMMUNITY): Payer: Self-pay | Admitting: Internal Medicine

## 2021-05-15 MED ORDER — TRAMADOL HCL 50 MG PO TABS: 50.0000 mg | ORAL_TABLET | Freq: Four times a day (QID) | ORAL | 0 refills | Status: DC | PRN

## 2021-05-15 NOTE — Telephone Encounter (Signed)
Received voicemail from patient that prescription was not at pharmacy.  Attempted to send, controlled, printed at site, shredded.  Forwarded to provider and Dr. Leonides Grills for assistance

## 2021-06-14 ENCOUNTER — Other Ambulatory Visit: Payer: Self-pay | Admitting: Obstetrics and Gynecology

## 2021-06-14 DIAGNOSIS — Z1231 Encounter for screening mammogram for malignant neoplasm of breast: Secondary | ICD-10-CM

## 2021-06-23 ENCOUNTER — Ambulatory Visit
Admission: RE | Admit: 2021-06-23 | Discharge: 2021-06-23 | Disposition: A | Discharge status: Home / Self Care | Payer: Managed Care, Other (non HMO) | Source: Ambulatory Visit | Attending: Obstetrics and Gynecology | Admitting: Obstetrics and Gynecology

## 2021-06-23 DIAGNOSIS — Z1231 Encounter for screening mammogram for malignant neoplasm of breast: Secondary | ICD-10-CM

## 2021-10-24 ENCOUNTER — Telehealth: Payer: Self-pay | Admitting: Allergy & Immunology

## 2021-10-24 NOTE — Telephone Encounter (Signed)
I contacted Nainika Newlun or the patient's caregiver to inform them that she received an expired dose of Pfizer COVID-19 vaccine from our office, during their visit(s), on June 2022. The dose was expired by 85 days. This was the patient's booster dose. I shared the following information with the patient or caregiver: vaccines given after they have expired may be less effective but we are not aware of any other adverse effects. The patient can be re-vaccinated at no cost if the patient decides to do so. Answered patient questions/concerns. Encouraged patient to reach out if they have any additional questions or concerns.  The patient decided to be re-vaccinated with the reformulated vaccine in the fall.  Malachi Bonds, MD [facility]

## 2022-01-22 NOTE — Telephone Encounter (Signed)
Medication resent to pharmacy  

## 2022-06-07 ENCOUNTER — Other Ambulatory Visit: Payer: Self-pay | Admitting: Obstetrics and Gynecology

## 2022-06-07 DIAGNOSIS — Z1231 Encounter for screening mammogram for malignant neoplasm of breast: Secondary | ICD-10-CM

## 2022-07-18 ENCOUNTER — Ambulatory Visit
Admission: RE | Admit: 2022-07-18 | Discharge: 2022-07-18 | Disposition: A | Discharge status: Home / Self Care | Payer: Managed Care, Other (non HMO) | Source: Ambulatory Visit | Attending: Obstetrics and Gynecology | Admitting: Obstetrics and Gynecology

## 2022-07-18 DIAGNOSIS — Z1231 Encounter for screening mammogram for malignant neoplasm of breast: Secondary | ICD-10-CM

## 2022-08-13 ENCOUNTER — Other Ambulatory Visit (HOSPITAL_BASED_OUTPATIENT_CLINIC_OR_DEPARTMENT_OTHER): Payer: Self-pay

## 2022-08-13 MED ORDER — MOUNJARO 2.5 MG/0.5ML ~~LOC~~ SOAJ
2.5000 mg | SUBCUTANEOUS | 0 refills | Status: DC
  Filled 2022-08-13: qty 2, 28d supply, fill #0

## 2022-08-14 ENCOUNTER — Other Ambulatory Visit (HOSPITAL_BASED_OUTPATIENT_CLINIC_OR_DEPARTMENT_OTHER): Payer: Self-pay

## 2022-08-14 MED ORDER — MOUNJARO 2.5 MG/0.5ML ~~LOC~~ SOAJ
2.5000 mg | SUBCUTANEOUS | 0 refills | Status: DC
  Filled 2022-08-14 – 2022-08-28 (×2): qty 2, 28d supply, fill #0

## 2022-08-16 ENCOUNTER — Other Ambulatory Visit (HOSPITAL_BASED_OUTPATIENT_CLINIC_OR_DEPARTMENT_OTHER): Payer: Self-pay

## 2022-08-17 ENCOUNTER — Encounter (HOSPITAL_BASED_OUTPATIENT_CLINIC_OR_DEPARTMENT_OTHER): Payer: Self-pay

## 2022-08-17 ENCOUNTER — Other Ambulatory Visit (HOSPITAL_BASED_OUTPATIENT_CLINIC_OR_DEPARTMENT_OTHER): Payer: Self-pay

## 2022-08-20 ENCOUNTER — Other Ambulatory Visit (HOSPITAL_BASED_OUTPATIENT_CLINIC_OR_DEPARTMENT_OTHER): Payer: Self-pay

## 2022-08-28 ENCOUNTER — Other Ambulatory Visit (HOSPITAL_BASED_OUTPATIENT_CLINIC_OR_DEPARTMENT_OTHER): Payer: Self-pay

## 2022-09-03 DIAGNOSIS — Z8249 Family history of ischemic heart disease and other diseases of the circulatory system: Secondary | ICD-10-CM | POA: Diagnosis not present

## 2022-09-03 DIAGNOSIS — Z6830 Body mass index (BMI) 30.0-30.9, adult: Secondary | ICD-10-CM | POA: Diagnosis not present

## 2022-09-03 DIAGNOSIS — I10 Essential (primary) hypertension: Secondary | ICD-10-CM | POA: Diagnosis not present

## 2022-09-03 DIAGNOSIS — Z833 Family history of diabetes mellitus: Secondary | ICD-10-CM | POA: Diagnosis not present

## 2022-09-03 DIAGNOSIS — E669 Obesity, unspecified: Secondary | ICD-10-CM | POA: Diagnosis not present

## 2022-09-03 DIAGNOSIS — Z823 Family history of stroke: Secondary | ICD-10-CM | POA: Diagnosis not present

## 2022-11-25 IMAGING — MG DIGITAL SCREENING BREAST BILAT IMPLANT W/ TOMO W/ CAD
8 of 12 series · 8 of 28 positions shown · non-contrast
Comparison: Previous exam(s).

CLINICAL DATA: Screening.

EXAM:
DIGITAL SCREENING BILATERAL MAMMOGRAM WITH IMPLANTS, CAD AND
TOMOSYNTHESIS
TECHNIQUE: Bilateral screening digital craniocaudal and mediolateral oblique
mammograms were obtained. Bilateral screening digital breast
tomosynthesis was performed. The images were evaluated with
computer-aided detection. Standard and/or implant displaced views
were performed.

[R MLO]
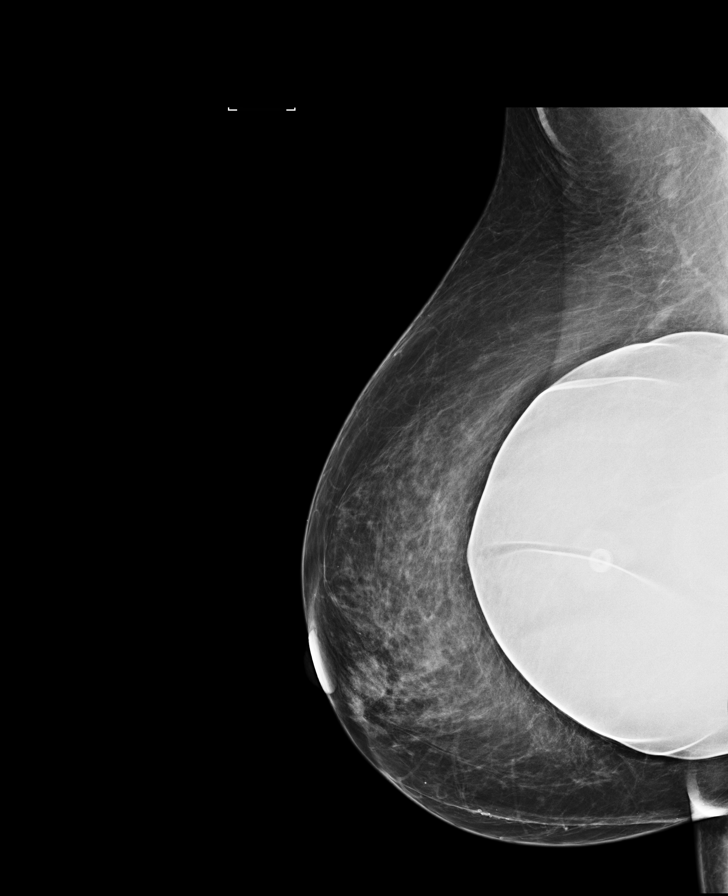

[L MLO]
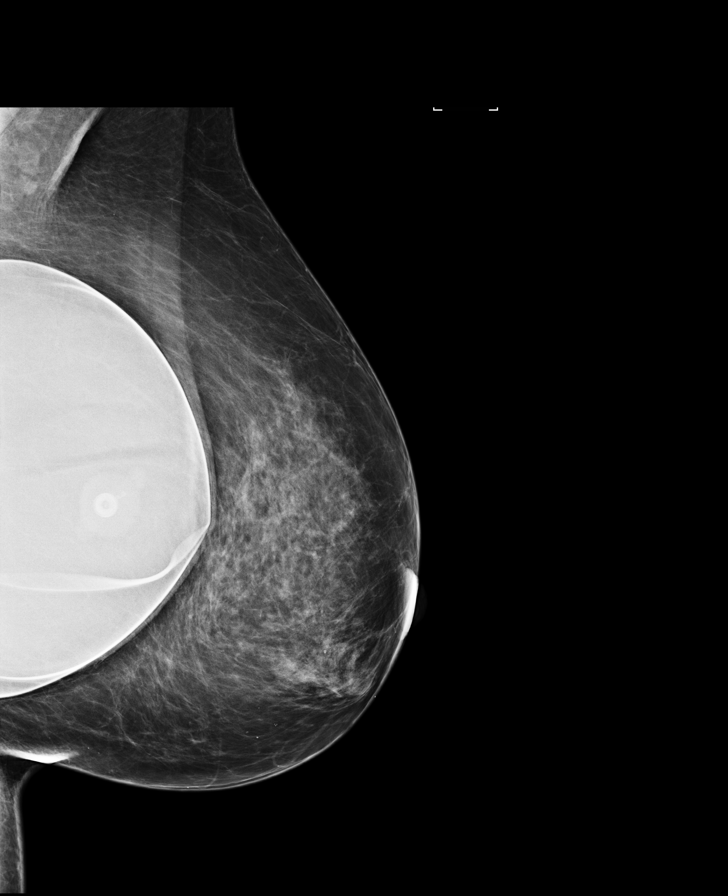

[R CC]
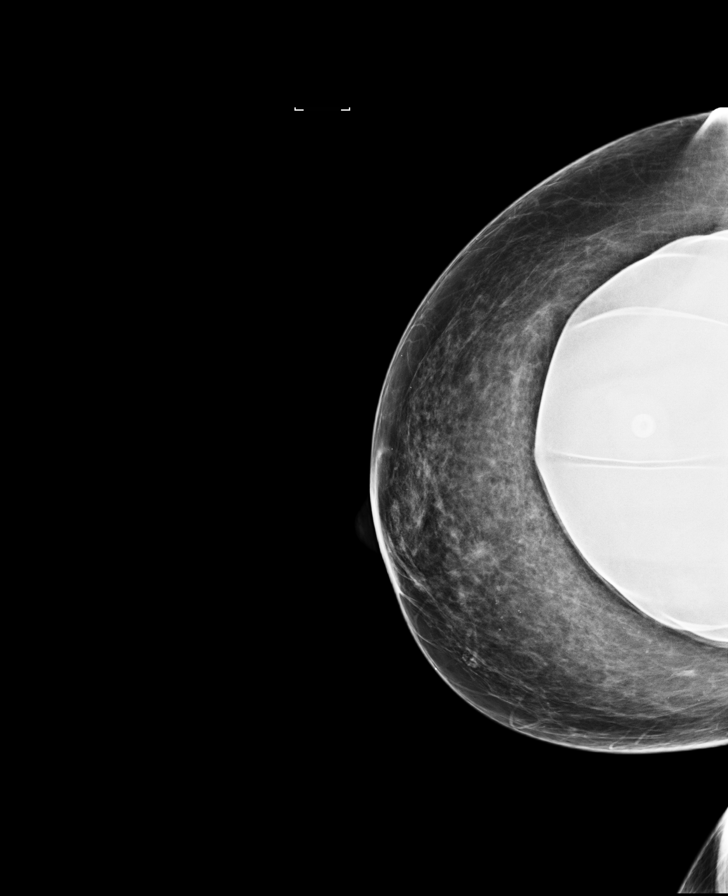

[L CC]
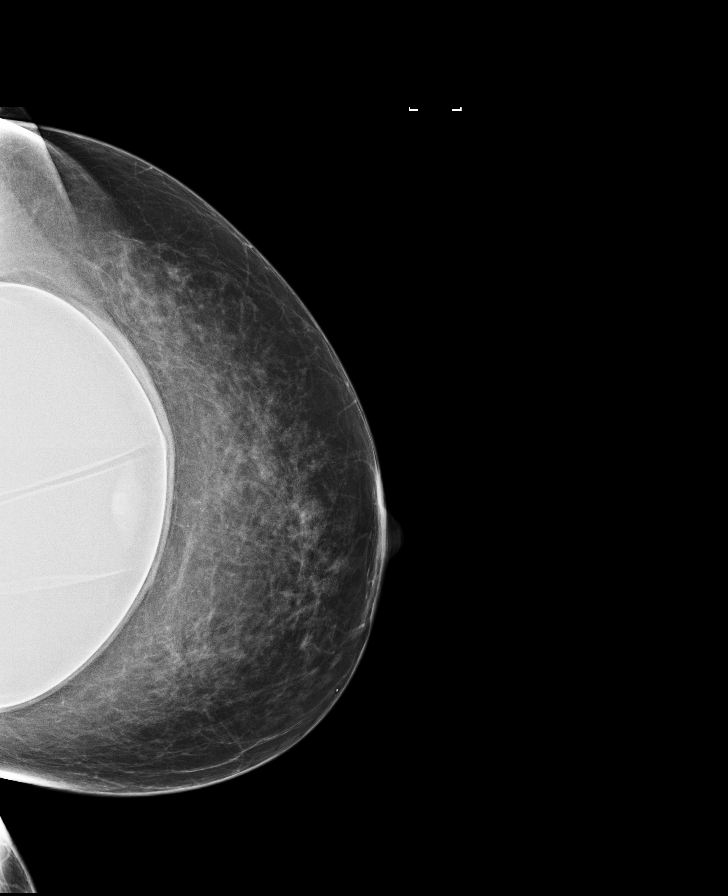

[L MLO synth-2D]
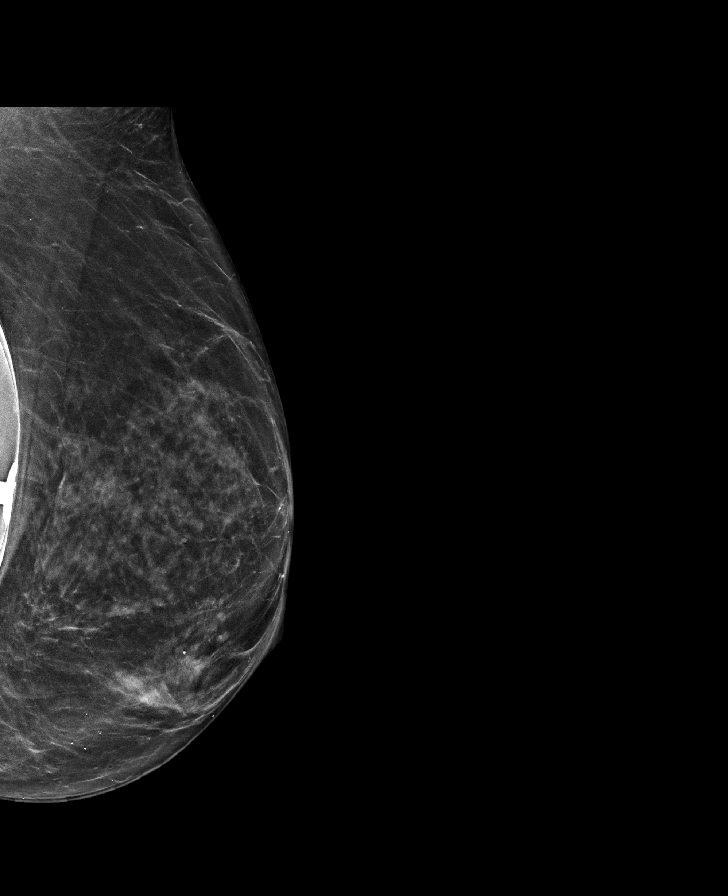

[L CC synth-2D]
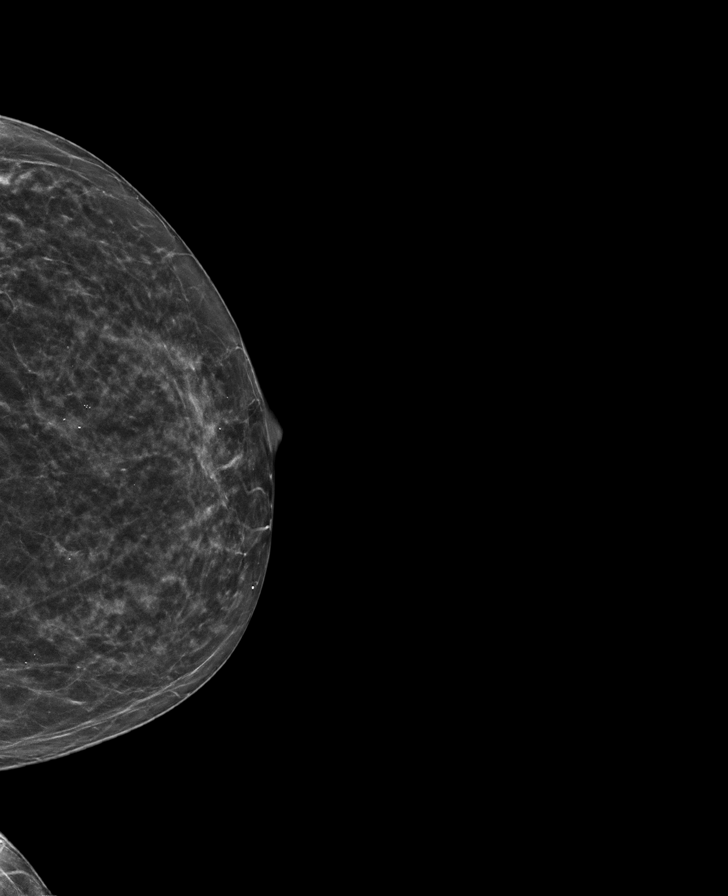

[R CC synth-2D]
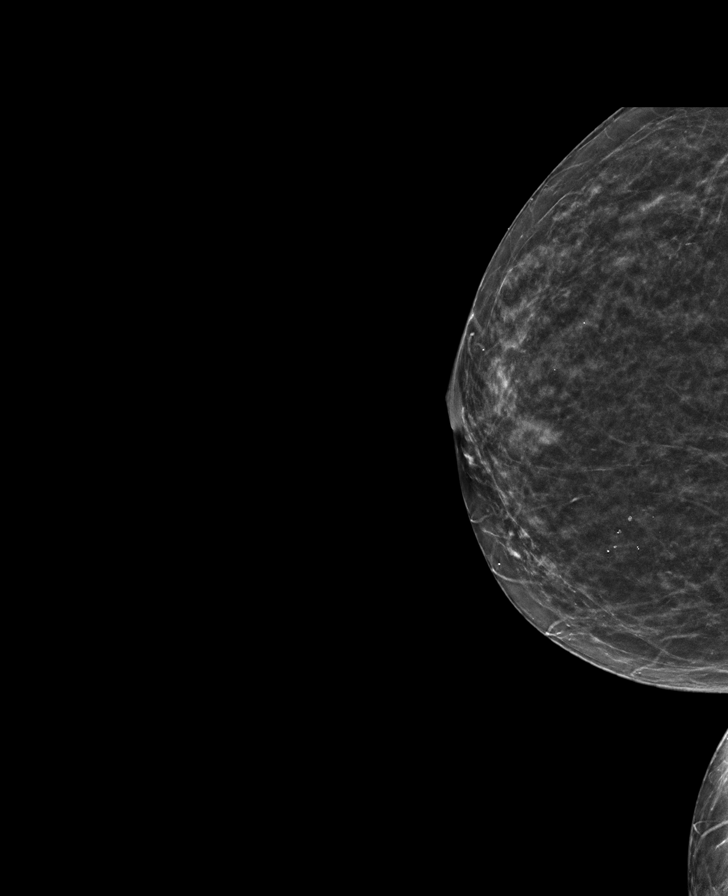

[R MLO synth-2D]
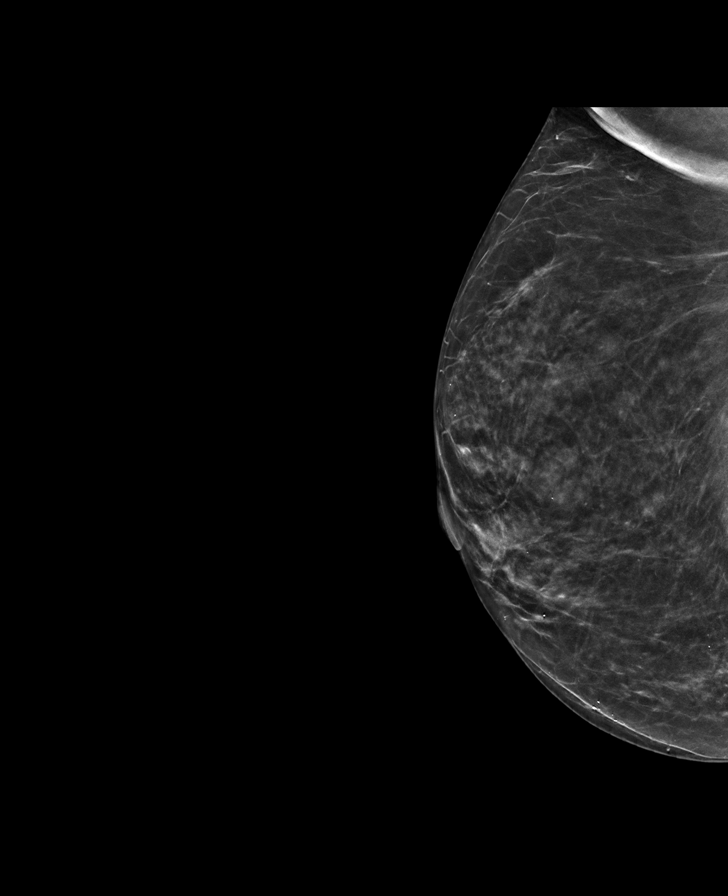

[8 of 28 positions shown; findings below may reference images not displayed]

ACR Breast Density Category b: There are scattered areas of
fibroglandular density.
FINDINGS: The patient has implants. There are no findings suspicious for
malignancy.
IMPRESSION: No mammographic evidence of malignancy. A result letter of this
screening mammogram will be mailed directly to the patient.

RECOMMENDATION:
Screening mammogram in one year. (Code:[id number])

BI-RADS CATEGORY  1:  Negative.

## 2022-12-12 ENCOUNTER — Emergency Department (HOSPITAL_BASED_OUTPATIENT_CLINIC_OR_DEPARTMENT_OTHER): Payer: Managed Care, Other (non HMO)

## 2022-12-12 ENCOUNTER — Encounter (HOSPITAL_BASED_OUTPATIENT_CLINIC_OR_DEPARTMENT_OTHER): Payer: Self-pay

## 2022-12-12 ENCOUNTER — Inpatient Hospital Stay (HOSPITAL_BASED_OUTPATIENT_CLINIC_OR_DEPARTMENT_OTHER)
Admission: EM | Admit: 2022-12-12 | Discharge: 2022-12-15 | DRG: 399 | Disposition: A | Discharge status: Home / Self Care | Payer: Managed Care, Other (non HMO) | Attending: Surgery | Admitting: Surgery

## 2022-12-12 ENCOUNTER — Other Ambulatory Visit: Payer: Self-pay

## 2022-12-12 DIAGNOSIS — R1031 Right lower quadrant pain: Secondary | ICD-10-CM | POA: Diagnosis not present

## 2022-12-12 DIAGNOSIS — M1990 Unspecified osteoarthritis, unspecified site: Secondary | ICD-10-CM | POA: Diagnosis present

## 2022-12-12 DIAGNOSIS — K3533 Acute appendicitis with perforation and localized peritonitis, with abscess: Secondary | ICD-10-CM | POA: Diagnosis not present

## 2022-12-12 DIAGNOSIS — I10 Essential (primary) hypertension: Secondary | ICD-10-CM | POA: Diagnosis present

## 2022-12-12 DIAGNOSIS — K3580 Unspecified acute appendicitis: Principal | ICD-10-CM

## 2022-12-12 DIAGNOSIS — Z79899 Other long term (current) drug therapy: Secondary | ICD-10-CM

## 2022-12-12 DIAGNOSIS — Z7985 Long-term (current) use of injectable non-insulin antidiabetic drugs: Secondary | ICD-10-CM

## 2022-12-12 DIAGNOSIS — K3532 Acute appendicitis with perforation and localized peritonitis, without abscess: Secondary | ICD-10-CM | POA: Diagnosis present

## 2022-12-12 DIAGNOSIS — Z8249 Family history of ischemic heart disease and other diseases of the circulatory system: Secondary | ICD-10-CM

## 2022-12-12 DIAGNOSIS — Z888 Allergy status to other drugs, medicaments and biological substances status: Secondary | ICD-10-CM

## 2022-12-12 HISTORY — DX: Prediabetes: R73.03

## 2022-12-12 LAB — COMPREHENSIVE METABOLIC PANEL
ALT: 20 U/L (ref 0–44)
AST: 17 U/L (ref 15–41)
Albumin: 4 g/dL (ref 3.5–5.0)
Alkaline Phosphatase: 82 U/L (ref 38–126)
Anion gap: 10 (ref 5–15)
BUN: 10 mg/dL (ref 6–20)
CO2: 26 mmol/L (ref 22–32)
Calcium: 8.9 mg/dL (ref 8.9–10.3)
Chloride: 100 mmol/L (ref 98–111)
Creatinine, Ser: 0.94 mg/dL (ref 0.44–1.00)
GFR, Estimated: >60 mL/min (ref >60)
Glucose, Bld: 97 mg/dL (ref 70–99)
Potassium: 3.7 mmol/L (ref 3.5–5.1)
Sodium: 136 mmol/L (ref 135–145)
Total Bilirubin: 0.5 mg/dL (ref 0.3–1.2)
Total Protein: 7.4 g/dL (ref 6.5–8.1)

## 2022-12-12 LAB — CBC
HCT: 34.1 % — ABNORMAL LOW (ref 36.0–46.0)
Hemoglobin: 11.2 g/dL — ABNORMAL LOW (ref 12.0–15.0)
MCH: 27.9 pg (ref 26.0–34.0)
MCHC: 32.8 g/dL (ref 30.0–36.0)
MCV: 84.8 fL (ref 80.0–100.0)
Platelets: 279 10*3/uL (ref 150–400)
RBC: 4.02 MIL/uL (ref 3.87–5.11)
RDW: 13.9 % (ref 11.5–15.5)
WBC: 9.1 10*3/uL (ref 4.0–10.5)
nRBC: 0 % (ref 0.0–0.2)

## 2022-12-12 LAB — LIPASE, BLOOD: Lipase: <10 U/L — ABNORMAL LOW (ref 11–51)

## 2022-12-12 MED ORDER — IOHEXOL 300 MG/ML  SOLN
100.0000 mL | Freq: Once | INTRAMUSCULAR | Status: AC | PRN
  Administered 2022-12-12: 85 mL via INTRAVENOUS

## 2022-12-12 MED ORDER — METRONIDAZOLE 500 MG/100ML IV SOLN
500.0000 mg | Freq: Once | INTRAVENOUS | Status: AC
  Administered 2022-12-13: 500 mg via INTRAVENOUS
  Filled 2022-12-12: qty 100

## 2022-12-12 MED ORDER — SODIUM CHLORIDE 0.9 % IV BOLUS
1000.0000 mL | Freq: Once | INTRAVENOUS | Status: AC
  Administered 2022-12-12: 1000 mL via INTRAVENOUS

## 2022-12-12 MED ORDER — SODIUM CHLORIDE 0.9 % IV SOLN
2.0000 g | Freq: Once | INTRAVENOUS | Status: AC
  Administered 2022-12-12: 2 g via INTRAVENOUS
  Filled 2022-12-12: qty 20

## 2022-12-12 MED ORDER — MORPHINE SULFATE (PF) 4 MG/ML IV SOLN
4.0000 mg | Freq: Once | INTRAVENOUS | Status: AC
  Administered 2022-12-12: 4 mg via INTRAVENOUS
  Filled 2022-12-12: qty 1

## 2022-12-12 NOTE — ED Triage Notes (Signed)
Lower abdominal pain onset yesterday, denies nausea, vomiting, diarrhea, constipation today. Denies fevers. Was seen at urgent care, negative UA, sent for r/o appendicitis/diverticulitis

## 2022-12-12 NOTE — ED Notes (Signed)
PT back from CT

## 2022-12-12 NOTE — H&P (Signed)
CC: RLQ pain  HPI: Olivia Pierce is an 57 y.o. female hx HTN, arthritis presented [facility] for evaluation of abdominal pain that she states started last night.  Never had this kind of pain before.  Pain is described as being sharp.  It is located in her lower abdomen, right lower quadrant.  Denies any fever/chills, nausea, vomiting, or any changes in her bowels including any diarrhea.  She denies any blood in her stool.  Last colonoscopy ***  Past Medical History:  Diagnosis Date   Arthritis    Hypertension     Past Surgical History:  Procedure Laterality Date   AUGMENTATION MAMMAPLASTY     late 67s early 30s did not remember when implants were placed     Family History  Problem Relation Age of Onset   Diabetes Mother    Hypertension Mother    Stroke Mother    Heart disease Mother    Heart disease Maternal Grandmother    Stroke Maternal Grandmother    Diabetes Maternal Grandmother    Breast cancer Neg Hx     Social:  reports that she has never smoked. She has never used smokeless tobacco. She reports current alcohol use. She reports that she does not use drugs.  Allergies:  Allergies  Allergen Reactions   Lisinopril     Medications: I have reviewed the patient's current medications.  Results for orders placed or performed during the hospital encounter of 12/12/22 (from the past 48 hour(s))  Lipase, blood     Status: Abnormal   Collection Time: 12/12/22  8:15 PM  Result Value Ref Range   Lipase <10 (L) 11 - 51 U/L    Comment: Performed at [facility], [address] [place], [place] [address]  Comprehensive metabolic panel     Status: None   Collection Time: 12/12/22  8:15 PM  Result Value Ref Range   Sodium 136 135 - 145 mmol/L   Potassium 3.7 3.5 - 5.1 mmol/L   Chloride 100 98 - 111 mmol/L   CO2 26 22 - 32 mmol/L   Glucose, Bld 97 70 - 99 mg/dL    Comment: Glucose reference range applies only to samples taken after  fasting for at least 8 hours.   BUN 10 6 - 20 mg/dL   Creatinine, Ser 0.86 0.44 - 1.00 mg/dL   Calcium 8.9 8.9 - 57.8 mg/dL   Total Protein 7.4 6.5 - 8.1 g/dL   Albumin 4.0 3.5 - 5.0 g/dL   AST 17 15 - 41 U/L   ALT 20 0 - 44 U/L   Alkaline Phosphatase 82 38 - 126 U/L   Total Bilirubin 0.5 0.3 - 1.2 mg/dL   GFR, Estimated >46 >96 mL/min    Comment: (NOTE) Calculated using the CKD-EPI Creatinine Equation (2021)    Anion gap 10 5 - 15    Comment: Performed at [facility], [address], [place], [place] [address]  CBC     Status: Abnormal   Collection Time: 12/12/22  8:15 PM  Result Value Ref Range   WBC 9.1 4.0 - 10.5 K/uL   RBC 4.02 3.87 - 5.11 MIL/uL   Hemoglobin 11.2 (L) 12.0 - 15.0 g/dL   HCT 41.3 (L) 24.4 - 01.0 %   MCV 84.8 80.0 - 100.0 fL   MCH 27.9 26.0 - 34.0 pg   MCHC 32.8 30.0 - 36.0 g/dL   RDW 27.2 53.6 - 64.4 %   Platelets 279  150 - 400 K/uL   nRBC 0.0 0.0 - 0.2 %    Comment: Performed at [facility], [address] [place], [place] [address]    CT ABDOMEN PELVIS W CONTRAST  Result Date: 12/12/2022 CLINICAL DATA:  Lower abdominal pain. EXAM: CT ABDOMEN AND PELVIS WITH CONTRAST TECHNIQUE: Multidetector CT imaging of the abdomen and pelvis was performed using the standard protocol following bolus administration of intravenous contrast. RADIATION DOSE REDUCTION: This exam was performed according to the departmental dose-optimization program which includes automated exposure control, adjustment of the mA and/or kV according to patient size and/or use of iterative reconstruction technique. CONTRAST:  85mL OMNIPAQUE IOHEXOL 300 MG/ML  SOLN COMPARISON:  None Available. FINDINGS: Lower chest: No acute abnormality. Hepatobiliary: No focal liver abnormality is seen. No gallstones, gallbladder wall thickening, or biliary dilatation. Pancreas: Unremarkable. No pancreatic ductal dilatation or surrounding inflammatory changes. Spleen: Normal  in size without focal abnormality. Adrenals/Urinary Tract: Adrenal glands are unremarkable. Kidneys are normal, without renal calculi, focal lesion, or hydronephrosis. Bladder is unremarkable. Stomach/Bowel: The appendix is fluid-filled and dilated with thickened wall measuring up to 12 mm. There is a large amount of surrounding inflammatory stranding and fluid. The proximal appendix at as it approximates the tip of the cecum is not well defined. No discrete enhancing fluid collection or free air. No bowel obstruction.  Stomach is within normal limits. Vascular/Lymphatic: No significant vascular findings are present. No enlarged abdominal or pelvic lymph nodes. Reproductive: Uterus and bilateral adnexa are unremarkable. Other: There is a small amount of free fluid in the pelvis and right lower quadrant. Musculoskeletal: No acute or significant osseous findings. IMPRESSION: 1. Findings compatible with acute appendicitis. There is a large amount of surrounding inflammatory stranding and fluid. The proximal appendix is not well defined, but there is no discrete enhancing fluid collection or free air at this time. 2. Small amount of free fluid in the pelvis and right lower quadrant. Electronically Signed   By: Darliss Cheney M.D.   On: 12/12/2022 23:21    ROS - all of the below systems have been reviewed with the patient and positives are indicated with bold text General: chills, fever or night sweats Eyes: blurry vision or double vision ENT: epistaxis or sore throat Allergy/Immunology: itchy/watery eyes or nasal congestion Hematologic/Lymphatic: bleeding problems, blood clots or swollen lymph nodes Endocrine: temperature intolerance or unexpected weight changes Breast: new or changing breast lumps or nipple discharge Resp: cough, shortness of breath, or wheezing CV: chest pain or dyspnea on exertion GI: as per HPI GU: dysuria, trouble voiding, or hematuria MSK: joint pain or joint stiffness Neuro: TIA or  stroke symptoms Derm: pruritus and skin lesion changes Psych: anxiety and depression  PE Blood pressure 129/64, pulse (!) 59, temperature 99.3 F (37.4 C), temperature source Oral, resp. rate 12, height 5\' 1"  (1.549 m), weight 77.1 kg, SpO2 100%. Constitutional: NAD; conversant Eyes: Moist conjunctiva; Lungs: Normal respiratory effort CV: RRR; no pitting edema GI: Abd ***; no palpable hepatosplenomegaly MSK: Normal range of motion of extremities Psychiatric: Appropriate affect Results for orders placed or performed during the hospital encounter of 12/12/22 (from the past 48 hour(s))  Lipase, blood     Status: Abnormal   Collection Time: 12/12/22  8:15 PM  Result Value Ref Range   Lipase <10 (L) 11 - 51 U/L    Comment: Performed at [facility], [address] [place], [place] [address]  Comprehensive metabolic panel     Status: None  Collection Time: 12/12/22  8:15 PM  Result Value Ref Range   Sodium 136 135 - 145 mmol/L   Potassium 3.7 3.5 - 5.1 mmol/L   Chloride 100 98 - 111 mmol/L   CO2 26 22 - 32 mmol/L   Glucose, Bld 97 70 - 99 mg/dL    Comment: Glucose reference range applies only to samples taken after fasting for at least 8 hours.   BUN 10 6 - 20 mg/dL   Creatinine, Ser 1.61 0.44 - 1.00 mg/dL   Calcium 8.9 8.9 - 09.6 mg/dL   Total Protein 7.4 6.5 - 8.1 g/dL   Albumin 4.0 3.5 - 5.0 g/dL   AST 17 15 - 41 U/L   ALT 20 0 - 44 U/L   Alkaline Phosphatase 82 38 - 126 U/L   Total Bilirubin 0.5 0.3 - 1.2 mg/dL   GFR, Estimated >04 >54 mL/min    Comment: (NOTE) Calculated using the CKD-EPI Creatinine Equation (2021)    Anion gap 10 5 - 15    Comment: Performed at [facility], [address], [place], [place] [address]  CBC     Status: Abnormal   Collection Time: 12/12/22  8:15 PM  Result Value Ref Range   WBC 9.1 4.0 - 10.5 K/uL   RBC 4.02 3.87 - 5.11 MIL/uL   Hemoglobin 11.2 (L) 12.0 - 15.0 g/dL   HCT 91.4 (L) 78.2 - 95.6 %    MCV 84.8 80.0 - 100.0 fL   MCH 27.9 26.0 - 34.0 pg   MCHC 32.8 30.0 - 36.0 g/dL   RDW 21.3 08.6 - 57.8 %   Platelets 279 150 - 400 K/uL   nRBC 0.0 0.0 - 0.2 %    Comment: Performed at [facility], [address], [place], [place] [address]    CT ABDOMEN PELVIS W CONTRAST  Result Date: 12/12/2022 CLINICAL DATA:  Lower abdominal pain. EXAM: CT ABDOMEN AND PELVIS WITH CONTRAST TECHNIQUE: Multidetector CT imaging of the abdomen and pelvis was performed using the standard protocol following bolus administration of intravenous contrast. RADIATION DOSE REDUCTION: This exam was performed according to the departmental dose-optimization program which includes automated exposure control, adjustment of the mA and/or kV according to patient size and/or use of iterative reconstruction technique. CONTRAST:  85mL OMNIPAQUE IOHEXOL 300 MG/ML  SOLN COMPARISON:  None Available. FINDINGS: Lower chest: No acute abnormality. Hepatobiliary: No focal liver abnormality is seen. No gallstones, gallbladder wall thickening, or biliary dilatation. Pancreas: Unremarkable. No pancreatic ductal dilatation or surrounding inflammatory changes. Spleen: Normal in size without focal abnormality. Adrenals/Urinary Tract: Adrenal glands are unremarkable. Kidneys are normal, without renal calculi, focal lesion, or hydronephrosis. Bladder is unremarkable. Stomach/Bowel: The appendix is fluid-filled and dilated with thickened wall measuring up to 12 mm. There is a large amount of surrounding inflammatory stranding and fluid. The proximal appendix at as it approximates the tip of the cecum is not well defined. No discrete enhancing fluid collection or free air. No bowel obstruction.  Stomach is within normal limits. Vascular/Lymphatic: No significant vascular findings are present. No enlarged abdominal or pelvic lymph nodes. Reproductive: Uterus and bilateral adnexa are unremarkable. Other: There is a small amount of free  fluid in the pelvis and right lower quadrant. Musculoskeletal: No acute or significant osseous findings. IMPRESSION: 1. Findings compatible with acute appendicitis. There is a large amount of surrounding inflammatory stranding and fluid. The proximal appendix is not well defined, but there is no discrete enhancing fluid collection or free  air at this time. 2. Small amount of free fluid in the pelvis and right lower quadrant. Electronically Signed   By: Darliss Cheney M.D.   On: 12/12/2022 23:21     A/P: Olivia Pierce is an 57 y.o. female with hx HTN, here with acute appendicitis   -NPO, MIVF -IV Zosyn -No evident abscess on CT; does have phlegmon  -Will discuss with oncoming surgeon Dr. Luisa Hart whom will make final decision on surgery  I spent a total of *** minutes in both face-to-face and non-face-to-face activities, excluding procedures performed, for this visit on the date of this encounter.  Marin Olp, MD [facility], A [facility]

## 2022-12-12 NOTE — ED Notes (Signed)
Pt resting; provided warm blanket- family at bedside and call light in reach.

## 2022-12-12 NOTE — ED Provider Notes (Signed)
[place] EMERGENCY DEPARTMENT AT [facility] Provider Note   CSN: [id number] Arrival date & time: 12/12/22  1948     History {Add pertinent medical, surgical, social history, OB history to HPI:1} Chief Complaint  Patient presents with   Abdominal Pain    Olivia Pierce is a 57 y.o. female history of hypertension presents today for evaluation of abdominal pain.  Symptoms started last night.  States that today symptom has been constant since this morning.  Pain is in her lower abdomen, constant, nonradiating.  Last bowel movement was 2 days ago.  She denies any urinary symptoms, blood in her stool or urine, bowel changes.  Denies any fever, nausea, vomiting, chest pain or shortness of breath.  History of C-sections otherwise no other abdominal surgery.  She has not tried any medication for pain at home.   Abdominal Pain   Past Medical History:  Diagnosis Date   Arthritis    Hypertension    Past Surgical History:  Procedure Laterality Date   AUGMENTATION MAMMAPLASTY     late 44s early 30s did not remember when implants were placed      Home Medications Prior to Admission medications   Medication Sig Start Date End Date Taking? Authorizing Provider  phentermine 37.5 MG capsule Take 1 capsule (37.5 mg total) by mouth every morning. 07/11/18   Arnette Felts, FNP  predniSONE (DELTASONE) 10 MG tablet Take 3 tabs (30mg ) twice daily for 3 days, then 2 tabs (20mg ) twice daily for 3 days, then 1 tab (10mg ) twice daily for 3 days, then STOP. 04/27/20   Alfonse Spruce, MD  sertraline (ZOLOFT) 25 MG tablet Take 1 tablet (25 mg total) by mouth daily. 07/11/18 07/11/19  Arnette Felts, FNP  tirzepatide [facility]) 2.5 MG/0.5ML Pen Inject 2.5 mg into the skin once a week. 08/12/22     tirzepatide (MOUNJARO) 2.5 MG/0.5ML Pen Inject 2.5 mg into the skin every 7 (seven) days. 08/14/22     traMADol (ULTRAM) 50 MG tablet Take 1 tablet (50 mg total) by mouth every 6 (six) hours as  needed. 05/15/21   Merrilee Jansky, MD  traZODone (DESYREL) 50 MG tablet Take 1 tablet (50 mg total) by mouth at bedtime. 07/11/18   Arnette Felts, FNP  triamterene-hydrochlorothiazide (MAXZIDE-25) 37.5-25 MG tablet TAKE 1 TABLET BY MOUTH DAILY 09/29/18   Arnette Felts, FNP      Allergies    Lisinopril    Review of Systems   Review of Systems  Gastrointestinal:  Positive for abdominal pain.    Physical Exam Updated Vital Signs BP 139/62 (BP Location: Left Arm)   Pulse 64   Temp 99.3 F (37.4 C) (Oral)   Resp 18   Ht 5\' 1"  (1.549 m)   Wt 77.1 kg   SpO2 100%   BMI 32.12 kg/m  Physical Exam Vitals and nursing note reviewed.  Constitutional:      Appearance: Normal appearance.  HENT:     Head: Normocephalic and atraumatic.     Mouth/Throat:     Mouth: Mucous membranes are moist.  Eyes:     General: No scleral icterus. Cardiovascular:     Rate and Rhythm: Normal rate and regular rhythm.     Pulses: Normal pulses.     Heart sounds: Normal heart sounds.  Pulmonary:     Effort: Pulmonary effort is normal.     Breath sounds: Normal breath sounds.  Abdominal:     General: Abdomen is flat.     Palpations:  Abdomen is soft.     Tenderness: There is abdominal tenderness in the right lower quadrant, suprapubic area and left lower quadrant.  Musculoskeletal:        General: No deformity.  Skin:    General: Skin is warm.     Findings: No rash.  Neurological:     General: No focal deficit present.     Mental Status: She is alert.  Psychiatric:        Mood and Affect: Mood normal.     ED Results / Procedures / Treatments   Labs (all labs ordered are listed, but only abnormal results are displayed) Labs Reviewed  LIPASE, BLOOD - Abnormal; Notable for the following components:      Result Value   Lipase <10 (*)    All other components within normal limits  CBC - Abnormal; Notable for the following components:   Hemoglobin 11.2 (*)    HCT 34.1 (*)    All other  components within normal limits  COMPREHENSIVE METABOLIC PANEL    EKG None  Radiology No results found.  Procedures Procedures  {Document cardiac monitor, telemetry assessment procedure when appropriate:1}  Medications Ordered in ED Medications  morphine (PF) 4 MG/ML injection 4 mg (4 mg Intravenous Given 12/12/22 2147)    ED Course/ Medical Decision Making/ A&P   {   Click here for ABCD2, HEART and other calculatorsREFRESH Note before signing :1}                              Medical Decision Making Amount and/or Complexity of Data Reviewed Labs: ordered. Radiology: ordered.  Risk Prescription drug management.   This patient presents to the ED for ***, this involves an extensive number of treatment options, and is a complaint that carries with a high risk of complications and morbidity.  The differential diagnosis includes ***.  This is not an exhaustive list.  Lab tests: I ordered and personally interpreted labs.  The pertinent results include: WBC unremarkable. Hbg unremarkable. Platelets unremarkable. Electrolytes unremarkable. BUN, creatinine unremarkable. ***  Imaging studies: I ordered imaging studies, personally reviewed, interpreted imaging and agree with the radiologist's interpretations. The results include: ***   Problem list/ ED course/ Critical interventions/ Medical management: HPI: See above Vital signs ***within normal range and stable throughout visit. Laboratory/imaging studies significant for: See above. On physical examination, patient is afebrile and appears in no acute distress. *** I have reviewed the patient home medicines and have made adjustments as needed.  Cardiac monitoring/EKG: The patient was maintained on a cardiac monitor.  I personally reviewed and interpreted the cardiac monitor which showed an underlying rhythm of: sinus rhythm.  Additional history obtained: External records from outside source obtained and reviewed including:  Chart review including previous notes, labs, imaging.  Consultations obtained:  Disposition Continued outpatient therapy. Follow-up with PCP recommended for reevaluation of symptoms. Treatment plan discussed with patient.  Pt acknowledged understanding was agreeable to the plan. Worrisome signs and symptoms were discussed with patient, and patient acknowledged understanding to return to the ED if they noticed these signs and symptoms. Patient was stable upon discharge.   This chart was dictated using voice recognition software.  Despite best efforts to proofread,  errors can occur which can change the documentation meaning.    {Document critical care time when appropriate:1} {Document review of labs and clinical decision tools ie heart score, Chads2Vasc2 etc:1}  {Document your independent review of radiology  images, and any outside records:1} {Document your discussion with family members, caretakers, and with consultants:1} {Document social determinants of health affecting pt's care:1} {Document your decision making why or why not admission, treatments were needed:1} Final Clinical Impression(s) / ED Diagnoses Final diagnoses:  None    Rx / DC Orders ED Discharge Orders     None

## 2022-12-12 NOTE — ED Provider Notes (Incomplete)
[place] EMERGENCY DEPARTMENT AT [facility] Provider Note   CSN: [id number] Arrival date & time: 12/12/22  1948     History {Add pertinent medical, surgical, social history, OB history to HPI:1} Chief Complaint  Patient presents with  . Abdominal Pain    Olivia Pierce is a 57 y.o. female history of hypertension presents today for evaluation of abdominal pain.  Symptoms started last night.  States that today symptom has been constant since this morning.  Pain is in her lower abdomen, constant, nonradiating.  Last bowel movement was 2 days ago.  She denies any urinary symptoms, blood in her stool or urine, bowel changes.  Denies any fever, nausea, vomiting, chest pain or shortness of breath.  History of C-sections otherwise no other abdominal surgery.  She has not tried any medication for pain at home.   Abdominal Pain   Past Medical History:  Diagnosis Date  . Arthritis   . Hypertension    Past Surgical History:  Procedure Laterality Date  . AUGMENTATION MAMMAPLASTY     late 68s early 30s did not remember when implants were placed      Home Medications Prior to Admission medications   Medication Sig Start Date End Date Taking? Authorizing Provider  phentermine 37.5 MG capsule Take 1 capsule (37.5 mg total) by mouth every morning. 07/11/18   Arnette Felts, FNP  predniSONE (DELTASONE) 10 MG tablet Take 3 tabs (30mg ) twice daily for 3 days, then 2 tabs (20mg ) twice daily for 3 days, then 1 tab (10mg ) twice daily for 3 days, then STOP. 04/27/20   Alfonse Spruce, MD  sertraline (ZOLOFT) 25 MG tablet Take 1 tablet (25 mg total) by mouth daily. 07/11/18 07/11/19  Arnette Felts, FNP  tirzepatide [facility]) 2.5 MG/0.5ML Pen Inject 2.5 mg into the skin once a week. 08/12/22     tirzepatide (MOUNJARO) 2.5 MG/0.5ML Pen Inject 2.5 mg into the skin every 7 (seven) days. 08/14/22     traMADol (ULTRAM) 50 MG tablet Take 1 tablet (50 mg total) by mouth every 6 (six) hours as  needed. 05/15/21   Merrilee Jansky, MD  traZODone (DESYREL) 50 MG tablet Take 1 tablet (50 mg total) by mouth at bedtime. 07/11/18   Arnette Felts, FNP  triamterene-hydrochlorothiazide (MAXZIDE-25) 37.5-25 MG tablet TAKE 1 TABLET BY MOUTH DAILY 09/29/18   Arnette Felts, FNP      Allergies    Lisinopril    Review of Systems   Review of Systems  Gastrointestinal:  Positive for abdominal pain.    Physical Exam Updated Vital Signs BP 139/62 (BP Location: Left Arm)   Pulse 64   Temp 99.3 F (37.4 C) (Oral)   Resp 18   Ht 5\' 1"  (1.549 m)   Wt 77.1 kg   SpO2 100%   BMI 32.12 kg/m  Physical Exam Vitals and nursing note reviewed.  Constitutional:      Appearance: Normal appearance.  HENT:     Head: Normocephalic and atraumatic.     Mouth/Throat:     Mouth: Mucous membranes are moist.  Eyes:     General: No scleral icterus. Cardiovascular:     Rate and Rhythm: Normal rate and regular rhythm.     Pulses: Normal pulses.     Heart sounds: Normal heart sounds.  Pulmonary:     Effort: Pulmonary effort is normal.     Breath sounds: Normal breath sounds.  Abdominal:     General: Abdomen is flat.     Palpations:  Abdomen is soft.     Tenderness: There is abdominal tenderness in the right lower quadrant, suprapubic area and left lower quadrant.  Musculoskeletal:        General: No deformity.  Skin:    General: Skin is warm.     Findings: No rash.  Neurological:     General: No focal deficit present.     Mental Status: She is alert.  Psychiatric:        Mood and Affect: Mood normal.     ED Results / Procedures / Treatments   Labs (all labs ordered are listed, but only abnormal results are displayed) Labs Reviewed  LIPASE, BLOOD - Abnormal; Notable for the following components:      Result Value   Lipase <10 (*)    All other components within normal limits  CBC - Abnormal; Notable for the following components:   Hemoglobin 11.2 (*)    HCT 34.1 (*)    All other  components within normal limits  COMPREHENSIVE METABOLIC PANEL    EKG None  Radiology No results found.  Procedures Procedures  {Document cardiac monitor, telemetry assessment procedure when appropriate:1}  Medications Ordered in ED Medications  morphine (PF) 4 MG/ML injection 4 mg (4 mg Intravenous Given 12/12/22 2147)    ED Course/ Medical Decision Making/ A&P   {   Click here for ABCD2, HEART and other calculatorsREFRESH Note before signing :1}                              Medical Decision Making Amount and/or Complexity of Data Reviewed Labs: ordered. Radiology: ordered.  Risk Prescription drug management.   This patient presents to the ED for abdominal pain, this involves an extensive number of treatment options, and is a complaint that carries with a high risk of complications and morbidity.  The differential diagnosis includes .diverticulitis, hernia, diverticulitis, UTI, constipation, female- testicular torsion, orchitis epididymitis, Fournier's; female-ectopic pregnancy, ovarian torsion, ovarian cyst, PID/TOA, period/fibroid..  This is not an exhaustive list.  Lab tests: I ordered and personally interpreted labs.  The pertinent results include: WBC unremarkable. Hbg unremarkable. Platelets unremarkable. Electrolytes unremarkable. BUN, creatinine unremarkable. ***  Imaging studies: I ordered imaging studies, personally reviewed, interpreted imaging and agree with the radiologist's interpretations. The results include: ***   Problem list/ ED course/ Critical interventions/ Medical management: HPI: See above Vital signs ***within normal range and stable throughout visit. Laboratory/imaging studies significant for: See above. On physical examination, patient is afebrile and appears in no acute distress. *** I have reviewed the patient home medicines and have made adjustments as needed.  Cardiac monitoring/EKG: The patient was maintained on a cardiac monitor.  I  personally reviewed and interpreted the cardiac monitor which showed an underlying rhythm of: sinus rhythm.  Additional history obtained: External records from outside source obtained and reviewed including: Chart review including previous notes, labs, imaging.  Consultations obtained:  Disposition Continued outpatient therapy. Follow-up with PCP recommended for reevaluation of symptoms. Treatment plan discussed with patient.  Pt acknowledged understanding was agreeable to the plan. Worrisome signs and symptoms were discussed with patient, and patient acknowledged understanding to return to the ED if they noticed these signs and symptoms. Patient was stable upon discharge.   This chart was dictated using voice recognition software.  Despite best efforts to proofread,  errors can occur which can change the documentation meaning.    {Document critical care time when appropriate:1} {Document  review of labs and clinical decision tools ie heart score, Chads2Vasc2 etc:1}  {Document your independent review of radiology images, and any outside records:1} {Document your discussion with family members, caretakers, and with consultants:1} {Document social determinants of health affecting pt's care:1} {Document your decision making why or why not admission, treatments were needed:1} Final Clinical Impression(s) / ED Diagnoses Final diagnoses:  None    Rx / DC Orders ED Discharge Orders     None

## 2022-12-13 ENCOUNTER — Emergency Department (HOSPITAL_COMMUNITY): Payer: Managed Care, Other (non HMO) | Admitting: Certified Registered Nurse Anesthetist

## 2022-12-13 ENCOUNTER — Encounter (HOSPITAL_COMMUNITY): Admission: EM | Disposition: A | Discharge status: Home / Self Care | Payer: Self-pay | Source: Home / Self Care

## 2022-12-13 ENCOUNTER — Other Ambulatory Visit: Payer: Self-pay

## 2022-12-13 ENCOUNTER — Encounter (HOSPITAL_COMMUNITY): Payer: Self-pay

## 2022-12-13 DIAGNOSIS — I10 Essential (primary) hypertension: Secondary | ICD-10-CM | POA: Diagnosis not present

## 2022-12-13 DIAGNOSIS — Z7985 Long-term (current) use of injectable non-insulin antidiabetic drugs: Secondary | ICD-10-CM | POA: Diagnosis not present

## 2022-12-13 DIAGNOSIS — K3533 Acute appendicitis with perforation and localized peritonitis, with abscess: Secondary | ICD-10-CM | POA: Diagnosis not present

## 2022-12-13 DIAGNOSIS — Z888 Allergy status to other drugs, medicaments and biological substances status: Secondary | ICD-10-CM | POA: Diagnosis not present

## 2022-12-13 DIAGNOSIS — K35211 Acute appendicitis with generalized peritonitis, with perforation and abscess: Secondary | ICD-10-CM

## 2022-12-13 DIAGNOSIS — R1031 Right lower quadrant pain: Secondary | ICD-10-CM | POA: Diagnosis not present

## 2022-12-13 DIAGNOSIS — K3532 Acute appendicitis with perforation and localized peritonitis, without abscess: Secondary | ICD-10-CM | POA: Diagnosis present

## 2022-12-13 DIAGNOSIS — Z8249 Family history of ischemic heart disease and other diseases of the circulatory system: Secondary | ICD-10-CM | POA: Diagnosis not present

## 2022-12-13 DIAGNOSIS — Z79899 Other long term (current) drug therapy: Secondary | ICD-10-CM | POA: Diagnosis not present

## 2022-12-13 DIAGNOSIS — M1990 Unspecified osteoarthritis, unspecified site: Secondary | ICD-10-CM | POA: Diagnosis not present

## 2022-12-13 HISTORY — PX: LAPAROSCOPIC APPENDECTOMY: SHX408

## 2022-12-13 SURGERY — APPENDECTOMY, LAPAROSCOPIC
Anesthesia: General

## 2022-12-13 MED ORDER — SERTRALINE HCL 50 MG PO TABS
25.0000 mg | ORAL_TABLET | Freq: Every day | ORAL | Status: DC
  Administered 2022-12-13 – 2022-12-15 (×3): 25 mg via ORAL
  Filled 2022-12-13 (×3): qty 1

## 2022-12-13 MED ORDER — ONDANSETRON 4 MG PO TBDP: 4.0000 mg | ORAL_TABLET | Freq: Four times a day (QID) | ORAL | Status: DC | PRN

## 2022-12-13 MED ORDER — KETOROLAC TROMETHAMINE 30 MG/ML IJ SOLN
INTRAMUSCULAR | Status: AC
  Filled 2022-12-13: qty 2

## 2022-12-13 MED ORDER — LACTATED RINGERS IV SOLN: INTRAVENOUS | Status: DC

## 2022-12-13 MED ORDER — SODIUM CHLORIDE 0.9 % IR SOLN
Status: DC | PRN
  Administered 2022-12-13: 1000 mL

## 2022-12-13 MED ORDER — ONDANSETRON HCL 4 MG/2ML IJ SOLN: 4.0000 mg | Freq: Four times a day (QID) | INTRAMUSCULAR | Status: DC | PRN

## 2022-12-13 MED ORDER — ROCURONIUM BROMIDE 10 MG/ML (PF) SYRINGE
PREFILLED_SYRINGE | INTRAVENOUS | Status: DC | PRN
  Administered 2022-12-13: 40 mg via INTRAVENOUS
  Administered 2022-12-13: 10 mg via INTRAVENOUS

## 2022-12-13 MED ORDER — TRAZODONE HCL 50 MG PO TABS
50.0000 mg | ORAL_TABLET | Freq: Every day | ORAL | Status: DC
  Administered 2022-12-13 – 2022-12-14 (×2): 50 mg via ORAL
  Filled 2022-12-13 (×2): qty 1

## 2022-12-13 MED ORDER — KETOROLAC TROMETHAMINE 15 MG/ML IJ SOLN
15.0000 mg | Freq: Four times a day (QID) | INTRAMUSCULAR | Status: DC | PRN
  Administered 2022-12-13 – 2022-12-14 (×3): 15 mg via INTRAVENOUS
  Filled 2022-12-13 (×3): qty 1

## 2022-12-13 MED ORDER — CHLORHEXIDINE GLUCONATE CLOTH 2 % EX PADS: 6.0000 | MEDICATED_PAD | Freq: Once | CUTANEOUS | Status: DC

## 2022-12-13 MED ORDER — DIPHENHYDRAMINE HCL 50 MG/ML IJ SOLN: 12.5000 mg | Freq: Four times a day (QID) | INTRAMUSCULAR | Status: DC | PRN

## 2022-12-13 MED ORDER — TIRZEPATIDE 2.5 MG/0.5ML ~~LOC~~ SOAJ: 2.5000 mg | SUBCUTANEOUS | Status: DC

## 2022-12-13 MED ORDER — PIPERACILLIN-TAZOBACTAM 3.375 G IVPB
3.3750 g | Freq: Three times a day (TID) | INTRAVENOUS | Status: DC
  Administered 2022-12-13 – 2022-12-15 (×6): 3.375 g via INTRAVENOUS
  Filled 2022-12-13 (×6): qty 50

## 2022-12-13 MED ORDER — DIPHENHYDRAMINE HCL 12.5 MG/5ML PO ELIX: 12.5000 mg | ORAL_SOLUTION | Freq: Four times a day (QID) | ORAL | Status: DC | PRN

## 2022-12-13 MED ORDER — KETOROLAC TROMETHAMINE 15 MG/ML IJ SOLN: 15.0000 mg | Freq: Four times a day (QID) | INTRAMUSCULAR | Status: AC

## 2022-12-13 MED ORDER — LIDOCAINE 2% (20 MG/ML) 5 ML SYRINGE
INTRAMUSCULAR | Status: AC
  Filled 2022-12-13: qty 15

## 2022-12-13 MED ORDER — ONDANSETRON HCL 4 MG/2ML IJ SOLN
INTRAMUSCULAR | Status: AC
  Filled 2022-12-13: qty 8

## 2022-12-13 MED ORDER — ORAL CARE MOUTH RINSE: 15.0000 mL | Freq: Once | OROMUCOSAL | Status: AC

## 2022-12-13 MED ORDER — BUPIVACAINE-EPINEPHRINE (PF) 0.5% -1:200000 IJ SOLN
INTRAMUSCULAR | Status: AC
  Filled 2022-12-13: qty 30

## 2022-12-13 MED ORDER — ENOXAPARIN SODIUM 40 MG/0.4ML IJ SOSY
40.0000 mg | PREFILLED_SYRINGE | INTRAMUSCULAR | Status: DC
  Administered 2022-12-14 – 2022-12-15 (×2): 40 mg via SUBCUTANEOUS
  Filled 2022-12-13 (×2): qty 0.4

## 2022-12-13 MED ORDER — BUPIVACAINE-EPINEPHRINE 0.5% -1:200000 IJ SOLN
INTRAMUSCULAR | Status: DC | PRN
  Administered 2022-12-13: 6 mL

## 2022-12-13 MED ORDER — TRAMADOL HCL 50 MG PO TABS
50.0000 mg | ORAL_TABLET | Freq: Four times a day (QID) | ORAL | Status: DC | PRN
  Administered 2022-12-13 – 2022-12-15 (×3): 50 mg via ORAL
  Filled 2022-12-13 (×3): qty 1

## 2022-12-13 MED ORDER — DEXAMETHASONE SODIUM PHOSPHATE 10 MG/ML IJ SOLN
INTRAMUSCULAR | Status: DC | PRN
  Administered 2022-12-13: 10 mg via INTRAVENOUS

## 2022-12-13 MED ORDER — FENTANYL CITRATE (PF) 250 MCG/5ML IJ SOLN
INTRAMUSCULAR | Status: DC | PRN
  Administered 2022-12-13 (×2): 50 ug via INTRAVENOUS

## 2022-12-13 MED ORDER — SUGAMMADEX SODIUM 200 MG/2ML IV SOLN
INTRAVENOUS | Status: DC | PRN
  Administered 2022-12-13: 200 mg via INTRAVENOUS

## 2022-12-13 MED ORDER — ROCURONIUM BROMIDE 10 MG/ML (PF) SYRINGE
PREFILLED_SYRINGE | INTRAVENOUS | Status: AC
  Filled 2022-12-13: qty 20

## 2022-12-13 MED ORDER — METOPROLOL TARTRATE 5 MG/5ML IV SOLN: 5.0000 mg | Freq: Four times a day (QID) | INTRAVENOUS | Status: DC | PRN

## 2022-12-13 MED ORDER — PROPOFOL 10 MG/ML IV BOLUS
INTRAVENOUS | Status: DC | PRN
Start: 2022-12-13 — End: 2022-12-13
  Administered 2022-12-13: 120 mg via INTRAVENOUS

## 2022-12-13 MED ORDER — FENTANYL CITRATE (PF) 250 MCG/5ML IJ SOLN
INTRAMUSCULAR | Status: AC
  Filled 2022-12-13: qty 5

## 2022-12-13 MED ORDER — MIDAZOLAM HCL 2 MG/2ML IJ SOLN
INTRAMUSCULAR | Status: DC | PRN
  Administered 2022-12-13: 2 mg via INTRAVENOUS

## 2022-12-13 MED ORDER — LIDOCAINE 2% (20 MG/ML) 5 ML SYRINGE
INTRAMUSCULAR | Status: DC | PRN
  Administered 2022-12-13: 60 mg via INTRAVENOUS

## 2022-12-13 MED ORDER — PHENTERMINE HCL 37.5 MG PO CAPS: 37.5000 mg | ORAL_CAPSULE | ORAL | Status: DC

## 2022-12-13 MED ORDER — CHLORHEXIDINE GLUCONATE 0.12 % MT SOLN
15.0000 mL | Freq: Once | OROMUCOSAL | Status: AC
  Administered 2022-12-13: 15 mL via OROMUCOSAL

## 2022-12-13 MED ORDER — METHOCARBAMOL 500 MG PO TABS
500.0000 mg | ORAL_TABLET | Freq: Four times a day (QID) | ORAL | Status: DC | PRN
  Administered 2022-12-13 – 2022-12-15 (×5): 500 mg via ORAL
  Filled 2022-12-13 (×5): qty 1

## 2022-12-13 MED ORDER — HYDROMORPHONE HCL 1 MG/ML IJ SOLN: 1.0000 mg | INTRAMUSCULAR | Status: DC | PRN

## 2022-12-13 MED ORDER — DEXAMETHASONE SODIUM PHOSPHATE 10 MG/ML IJ SOLN
INTRAMUSCULAR | Status: AC
  Filled 2022-12-13: qty 3

## 2022-12-13 MED ORDER — MIDAZOLAM HCL 2 MG/2ML IJ SOLN
INTRAMUSCULAR | Status: AC
  Filled 2022-12-13: qty 2

## 2022-12-13 MED ORDER — KETOROLAC TROMETHAMINE 30 MG/ML IJ SOLN
INTRAMUSCULAR | Status: DC | PRN
  Administered 2022-12-13: 30 mg via INTRAVENOUS

## 2022-12-13 MED ORDER — OXYCODONE-ACETAMINOPHEN 5-325 MG PO TABS: 1.0000 | ORAL_TABLET | ORAL | Status: DC | PRN

## 2022-12-13 MED ORDER — 0.9 % SODIUM CHLORIDE (POUR BTL) OPTIME
TOPICAL | Status: DC | PRN
  Administered 2022-12-13: 1000 mL

## 2022-12-13 MED ORDER — TRIAMTERENE-HCTZ 37.5-25 MG PO TABS
1.0000 | ORAL_TABLET | Freq: Every day | ORAL | Status: DC
  Administered 2022-12-13 – 2022-12-15 (×3): 1 via ORAL
  Filled 2022-12-13 (×3): qty 1

## 2022-12-13 MED ORDER — ONDANSETRON HCL 4 MG/2ML IJ SOLN
INTRAMUSCULAR | Status: DC | PRN
  Administered 2022-12-13: 4 mg via INTRAVENOUS

## 2022-12-13 MED ORDER — DEXTROSE-SODIUM CHLORIDE 5-0.9 % IV SOLN: INTRAVENOUS | Status: DC

## 2022-12-13 MED ORDER — HYDROMORPHONE HCL 1 MG/ML IJ SOLN: 0.2500 mg | INTRAMUSCULAR | Status: DC | PRN

## 2022-12-13 SURGICAL SUPPLY — 52 items
ADH SKN CLS APL DERMABOND .7 (GAUZE/BANDAGES/DRESSINGS) ×1
APL PRP STRL LF DISP 70% ISPRP (MISCELLANEOUS) ×1
APPLIER CLIP ROT 10 11.4 M/L (STAPLE)
APR CLP MED LRG 11.4X10 (STAPLE)
BAG COUNTER SPONGE SURGICOUNT (BAG) ×2 IMPLANT
BAG SPEC RTRVL 10 TROC 200 (ENDOMECHANICALS) ×1
BAG SPNG CNTER NS LX DISP (BAG) ×1
BLADE CLIPPER SURG (BLADE) IMPLANT
CANISTER SUCT 3000ML PPV (MISCELLANEOUS) ×2 IMPLANT
CHLORAPREP W/TINT 26 (MISCELLANEOUS) ×2 IMPLANT
CLIP APPLIE ROT 10 11.4 M/L (STAPLE) IMPLANT
COVER SURGICAL LIGHT HANDLE (MISCELLANEOUS) ×2 IMPLANT
CUTTER FLEX LINEAR 45M (STAPLE) ×2 IMPLANT
DERMABOND ADVANCED .7 DNX12 (GAUZE/BANDAGES/DRESSINGS) ×2 IMPLANT
DRAIN CHANNEL 19F RND (DRAIN) IMPLANT
DRAIN RELI 100 BL SUC LF ST (DRAIN) ×1
ELECT REM PT RETURN 9FT ADLT (ELECTROSURGICAL) ×1
ELECTRODE REM PT RTRN 9FT ADLT (ELECTROSURGICAL) ×2 IMPLANT
ENDOLOOP SUT PDS II 0 18 (SUTURE) IMPLANT
EVACUATOR SILICONE 100CC (DRAIN) IMPLANT
GLOVE BIO SURGEON STRL SZ8 (GLOVE) ×2 IMPLANT
GLOVE BIOGEL PI IND STRL 8 (GLOVE) ×2 IMPLANT
GOWN STRL REUS W/ TWL LRG LVL3 (GOWN DISPOSABLE) ×4 IMPLANT
GOWN STRL REUS W/ TWL XL LVL3 (GOWN DISPOSABLE) ×2 IMPLANT
GOWN STRL REUS W/TWL LRG LVL3 (GOWN DISPOSABLE) ×2
GOWN STRL REUS W/TWL XL LVL3 (GOWN DISPOSABLE) ×1
IRRIG SUCT STRYKERFLOW 2 WTIP (MISCELLANEOUS) ×1
IRRIGATION SUCT STRKRFLW 2 WTP (MISCELLANEOUS) ×2 IMPLANT
KIT BASIN OR (CUSTOM PROCEDURE TRAY) ×2 IMPLANT
KIT TURNOVER KIT B (KITS) ×2 IMPLANT
NS IRRIG 1000ML POUR BTL (IV SOLUTION) ×2 IMPLANT
PAD ARMBOARD 7.5X6 YLW CONV (MISCELLANEOUS) ×4 IMPLANT
POUCH RETRIEVAL ECOSAC 10 (ENDOMECHANICALS) ×2 IMPLANT
RELOAD 45 VASCULAR/THIN (ENDOMECHANICALS) ×4 IMPLANT
RELOAD STAPLE 45 2.5 WHT GRN (ENDOMECHANICALS) IMPLANT
RELOAD STAPLE 45 3.5 BLU ETS (ENDOMECHANICALS) ×2 IMPLANT
RELOAD STAPLE TA45 3.5 REG BLU (ENDOMECHANICALS) IMPLANT
SCISSORS LAP 5X35 DISP (ENDOMECHANICALS) ×2 IMPLANT
SET TUBE SMOKE EVAC HIGH FLOW (TUBING) ×2 IMPLANT
SHEARS HARMONIC ACE PLUS 36CM (ENDOMECHANICALS) ×2 IMPLANT
SPECIMEN JAR SMALL (MISCELLANEOUS) ×2 IMPLANT
SUT ETHILON 2 0 FS 18 (SUTURE) IMPLANT
SUT MON AB 4-0 PC3 18 (SUTURE) ×2 IMPLANT
TOWEL GREEN STERILE (TOWEL DISPOSABLE) ×2 IMPLANT
TOWEL GREEN STERILE FF (TOWEL DISPOSABLE) ×2 IMPLANT
TRAY FOLEY W/BAG SLVR 16FR (SET/KITS/TRAYS/PACK) ×1
TRAY FOLEY W/BAG SLVR 16FR ST (SET/KITS/TRAYS/PACK) ×2 IMPLANT
TRAY LAPAROSCOPIC MC (CUSTOM PROCEDURE TRAY) ×2 IMPLANT
TROCAR BALLN 12MMX100 BLUNT (TROCAR) ×2 IMPLANT
TROCAR XCEL BLADELESS 5X75MML (TROCAR) ×4 IMPLANT
WARMER LAPAROSCOPE (MISCELLANEOUS) ×2 IMPLANT
WATER STERILE IRR 1000ML POUR (IV SOLUTION) ×2 IMPLANT

## 2022-12-13 NOTE — Progress Notes (Signed)
Pt admitted to 6N09 from pacu s/p Lap appe, with sites intact with dermabond and one JP drain. Pt alert and oriented x4.

## 2022-12-13 NOTE — Interval H&P Note (Signed)
History and Physical Interval Note:  12/13/2022 10:16 AM  Olivia Pierce  has presented today for surgery, with the diagnosis of appendicitis.  The various methods of treatment have been discussed with the patient and family. After consideration of risks, benefits and other options for treatment, the patient has consented to  Procedure(s): APPENDECTOMY LAPAROSCOPIC (N/A) as a surgical intervention.  The patient's history has been reviewed, patient examined, no change in status, stable for surgery.  I have reviewed the patient's chart and labs.  Questions were answered to the patient's satisfaction.     Chanequa Spees A Trashawn Oquendo

## 2022-12-13 NOTE — Anesthesia Procedure Notes (Signed)
Procedure Name: Intubation Date/Time: 12/13/2022 10:45 AM  Performed by: Darryl Nestle, CRNAPre-anesthesia Checklist: Patient identified, Emergency Drugs available, Suction available and Patient being monitored Patient Re-evaluated:Patient Re-evaluated prior to induction Oxygen Delivery Method: Circle system utilized Preoxygenation: Pre-oxygenation with 100% oxygen Induction Type: IV induction Ventilation: Mask ventilation without difficulty Laryngoscope Size: Mac and 3 Grade View: Grade I Tube type: Oral Tube size: 7.0 mm Number of attempts: 1 Airway Equipment and Method: Stylet Placement Confirmation: ETT inserted through vocal cords under direct vision, positive ETCO2 and breath sounds checked- equal and bilateral Secured at: 21 cm Tube secured with: Tape Dental Injury: Teeth and Oropharynx as per pre-operative assessment  Comments: EMS student intubated the patient

## 2022-12-13 NOTE — ED Notes (Signed)
Patient resting quietly in stretcher, respirations even, unlabored, no acute distress noted. Denies needs at this time. Patient's family leaving at this time.

## 2022-12-13 NOTE — Progress Notes (Signed)
[facility] Pre-op Review  Pre-op checklist: not completed, patient will need CHG wipes upon arrival to short stay  NPO: since 1230 lunch time 12/12/22  Labs: hgb 11.2,    Consent: no orders, Pa is aware patient is coming to short stay Tresa Endo or Nehemiah Settle is the PAs for [facility]  H&P: 12/12/22  Vitals: stable  O2 requirements: none  MAR/PTA review: no meds  IV: 20G R AC  Floor nurse name:  Merry Proud at [facility]   Additional info:   [facility] is at [facility] 9:09 AM to get patient.  Rn stated patient is in hospital gown and currently removing all personal belongings

## 2022-12-13 NOTE — ED Notes (Signed)
Thomas with [facility] called for transport

## 2022-12-13 NOTE — Transfer of Care (Signed)
Immediate Anesthesia Transfer of Care Note  Patient: Olivia Pierce  Procedure(s) Performed: APPENDECTOMY LAPAROSCOPIC  Patient Location: PACU  Anesthesia Type:General  Level of Consciousness: awake  Airway & Oxygen Therapy: Patient Spontanous Breathing  Post-op Assessment: Report given to RN and Post -op Vital signs reviewed and stable  Post vital signs: Reviewed and stable  Last Vitals:  Vitals Value Taken Time  BP 127/80 12/13/22 1201  Temp 98   Pulse 65 12/13/22 1203  Resp 18 12/13/22 1203  SpO2 100 % 12/13/22 1203  Vitals shown include unfiled device data.  Last Pain:  Vitals:   12/13/22 0956  TempSrc:   PainSc: 2          Complications: No notable events documented.

## 2022-12-13 NOTE — Anesthesia Postprocedure Evaluation (Signed)
Anesthesia Post Note  Patient: Olivia Pierce  Procedure(s) Performed: APPENDECTOMY LAPAROSCOPIC     Patient location during evaluation: PACU Anesthesia Type: General Level of consciousness: awake and alert Pain management: pain level controlled Vital Signs Assessment: post-procedure vital signs reviewed and stable Respiratory status: spontaneous breathing, nonlabored ventilation and respiratory function stable Cardiovascular status: blood pressure returned to baseline and stable Postop Assessment: no apparent nausea or vomiting Anesthetic complications: no  No notable events documented.  Last Vitals:  Vitals:   12/13/22 1230 12/13/22 1245  BP: 139/73 136/68  Pulse: (!) 58 60  Resp: 16 19  Temp:    SpO2: 97% 97%    Last Pain:  Vitals:   12/13/22 1200  TempSrc:   PainSc: Asleep                 Onofrio Klemp,W. EDMOND

## 2022-12-13 NOTE — ED Notes (Signed)
Patient ambulatory to restroom with steady gait, now resting quietly in stretcher, respirations even, unlabored, no acute distress noted. Denies needs at this time.

## 2022-12-13 NOTE — Anesthesia Preprocedure Evaluation (Addendum)
Anesthesia Evaluation  Patient identified by MRN, date of birth, ID band Patient awake    Reviewed: Allergy & Precautions, H&P , NPO status , Patient's Chart, lab work & pertinent test results  Airway Mallampati: II  TM Distance: >3 FB Neck ROM: Full    Dental no notable dental hx. (+) Teeth Intact, Dental Advisory Given   Pulmonary neg pulmonary ROS   Pulmonary exam normal breath sounds clear to auscultation       Cardiovascular hypertension, Pt. on medications  Rhythm:Regular Rate:Normal     Neuro/Psych negative neurological ROS  negative psych ROS   GI/Hepatic negative GI ROS, Neg liver ROS,,,  Endo/Other  negative endocrine ROS    Renal/GU negative Renal ROS  negative genitourinary   Musculoskeletal  (+) Arthritis , Osteoarthritis,    Abdominal   Peds  Hematology negative hematology ROS (+)   Anesthesia Other Findings   Reproductive/Obstetrics negative OB ROS                             Anesthesia Physical Anesthesia Plan  ASA: 2  Anesthesia Plan: General   Post-op Pain Management: Ofirmev IV (intra-op)* and Toradol IV (intra-op)*   Induction: Intravenous, Rapid sequence and Cricoid pressure planned  PONV Risk Score and Plan: 4 or greater and Ondansetron, Dexamethasone and Midazolam  Airway Management Planned: Oral ETT  Additional Equipment:   Intra-op Plan:   Post-operative Plan: Extubation in OR  Informed Consent: I have reviewed the patients History and Physical, chart, labs and discussed the procedure including the risks, benefits and alternatives for the proposed anesthesia with the patient or authorized representative who has indicated his/her understanding and acceptance.     Dental advisory given  Plan Discussed with: CRNA  Anesthesia Plan Comments:        Anesthesia Quick Evaluation

## 2022-12-13 NOTE — Op Note (Signed)
Preoperative diagnosis: Acute appendicitis with phlegmon  Postoperative diagnosis: Perforated appendicitis with periappendiceal abscess  Procedure: Laparoscopic appendectomy  Surgeon: Harriette Bouillon, MD  Anesthesia: General With 0.25% Marcaine with epinephrine  EBL: 40 cc  Specimen: Appendix  Drains: 19 round to right gutter  Indications for procedure: The patient is a 57 year old female with a 2-day history of abdominal pain.  [facility] was evaluated last night found to have appendicitis with questionable phlegmon.  She was transferred to [facility] for definitive therapy.  We discussed options of medical management.  Literature on that as well as operative intervention.  Risks and benefits as well as long-term expectations quality of life issues failure treatments were reviewed.  She opted for laparoscopic appendectomy.The procedure has been discussed with the patient.  Alternative therapies have been discussed with the patient.  Operative risks include bleeding,  Infection,  Organ injury,  Nerve injury,  Blood vessel injury,  DVT,  Pulmonary embolism,  Death,  And possible reoperation.  Medical management risks include worsening of present situation.  The success of the procedure is 50 -90 % at treating patients symptoms.  The patient understands and agrees to proceed.    Description of procedure: The patient was met in the holding area questions were answered.  She was taken back to the operating placed upon upon the table.  After induction of general esthesia, both arms were tucked and the abdomen was prepped and draped in sterile fashion.  Timeout performed.  She was already on antibiotics.  A 1 cm supraumbilical incision was made.  Dissection was carried down to the fascia and the fascia was opened the midline.  A pursestring suture of 0 Vicryl was placed.  We entered the peritoneal cavity under direct vision.  A 12 mm Hassan port was placed.  We then placed 2 additional ports  1 in the right upper quadrant and the other left lower quadrant which were 5 mm ports.  She was placed in Trendelenburg and rolled her left.  The appendix was obscured into the terminal ileum.  There was significant inflammation involving the appendix itself.  The cecum was grasped and pulled cranially.  I was able to carefully peel away the terminal ileum from the appendix.  It was severely claimed.  Began to dissect around the hip and found a large abscess.  This was partially involving the cecum.  This pus was drained out.  The mesentery was very difficult to visualize.  I felt that a white load GIA 45 be the best way to deal with this.  This was brought in the field.  I was then able to inserted placed across the base of the appendix.  The tissues were of poor quality tore somewhat.  I was able to place 3 firings across the base of the cecum.  This appeared to hold.  The appendix was then placed into an equal patch and removed.  We then reexamined the stump area carefully.  There is no significant bleeding.  Irrigation was used.  Tissues were of poor quality and any further manipulation I felt detrimental to the patient.  I did not see any leakage from the stump and opted to place 19 round drain via the left lower quadrant port site into the right gutter over this area.  Irrigation was used.  This was returned to clear.  Reexamination showed no bleeding.  And no leakage stopped.  The umbilical port was removed.  Pursestring suture tied.  Right upper quadrant  port removed.  Skin closed with 4 Monocryl and Dermabond.  All counts found to be correct.  The patient was awoke extubated taken recovery in satisfactory condition.

## 2022-12-13 NOTE — ED Notes (Signed)
Patient resting quietly in stretcher, respirations even, unlabored, no acute distress noted. Denies needs at this time.  

## 2022-12-14 ENCOUNTER — Encounter (HOSPITAL_COMMUNITY): Payer: Self-pay | Admitting: Surgery

## 2022-12-14 NOTE — Progress Notes (Signed)
1 Day Post-Op  Subjective: Looks good today.  Eating breakfast.  + flatus. Pain very well managed.  ROS: See above, otherwise other systems negative  Objective: Vital signs in last 24 hours: Temp:  [97.7 F (36.5 C)-98.7 F (37.1 C)] 97.9 F (36.6 C) (08/30 0809) Pulse Rate:  [51-70] 51 (08/30 0809) Resp:  [14-19] 17 (08/30 0809) BP: (97-139)/(64-83) 107/64 (08/30 0809) SpO2:  [96 %-100 %] 99 % (08/30 0809) Weight:  [77.1 kg] 77.1 kg (08/29 0946)    Intake/Output from previous day: 08/29 0701 - 08/30 0700 In: 1256.3 [I.V.:1256.3] Out: 295 [Drains:245; Blood:50] Intake/Output this shift: No intake/output data recorded.  PE: Abd: soft, appropriately tender, JP with minimal serosang output, just emptied, 245cc documented since placement.  Incisions c/d/i   Lab Results:  Recent Labs    12/12/22 2015  WBC 9.1  HGB 11.2*  HCT 34.1*  PLT 279   BMET Recent Labs    12/12/22 2015  NA 136  K 3.7  CL 100  CO2 26  GLUCOSE 97  BUN 10  CREATININE 0.94  CALCIUM 8.9   PT/INR No results for input(s): "LABPROT", "INR" in the last 72 hours. CMP     Component Value Date/Time   NA 136 12/12/2022 2015   K 3.7 12/12/2022 2015   CL 100 12/12/2022 2015   CO2 26 12/12/2022 2015   GLUCOSE 97 12/12/2022 2015   BUN 10 12/12/2022 2015   CREATININE 0.94 12/12/2022 2015   CALCIUM 8.9 12/12/2022 2015   PROT 7.4 12/12/2022 2015   ALBUMIN 4.0 12/12/2022 2015   AST 17 12/12/2022 2015   ALT 20 12/12/2022 2015   ALKPHOS 82 12/12/2022 2015   BILITOT 0.5 12/12/2022 2015   GFRNONAA >60 12/12/2022 2015   Lipase     Component Value Date/Time   LIPASE <10 (L) 12/12/2022 2015       Studies/Results: CT ABDOMEN PELVIS W CONTRAST  Result Date: 12/12/2022 CLINICAL DATA:  Lower abdominal pain. EXAM: CT ABDOMEN AND PELVIS WITH CONTRAST TECHNIQUE: Multidetector CT imaging of the abdomen and pelvis was performed using the standard protocol following bolus administration of  intravenous contrast. RADIATION DOSE REDUCTION: This exam was performed according to the departmental dose-optimization program which includes automated exposure control, adjustment of the mA and/or kV according to patient size and/or use of iterative reconstruction technique. CONTRAST:  85mL OMNIPAQUE IOHEXOL 300 MG/ML  SOLN COMPARISON:  None Available. FINDINGS: Lower chest: No acute abnormality. Hepatobiliary: No focal liver abnormality is seen. No gallstones, gallbladder wall thickening, or biliary dilatation. Pancreas: Unremarkable. No pancreatic ductal dilatation or surrounding inflammatory changes. Spleen: Normal in size without focal abnormality. Adrenals/Urinary Tract: Adrenal glands are unremarkable. Kidneys are normal, without renal calculi, focal lesion, or hydronephrosis. Bladder is unremarkable. Stomach/Bowel: The appendix is fluid-filled and dilated with thickened wall measuring up to 12 mm. There is a large amount of surrounding inflammatory stranding and fluid. The proximal appendix at as it approximates the tip of the cecum is not well defined. No discrete enhancing fluid collection or free air. No bowel obstruction.  Stomach is within normal limits. Vascular/Lymphatic: No significant vascular findings are present. No enlarged abdominal or pelvic lymph nodes. Reproductive: Uterus and bilateral adnexa are unremarkable. Other: There is a small amount of free fluid in the pelvis and right lower quadrant. Musculoskeletal: No acute or significant osseous findings. IMPRESSION: 1. Findings compatible with acute appendicitis. There is a large amount of surrounding inflammatory stranding and fluid. The proximal appendix is not  well defined, but there is no discrete enhancing fluid collection or free air at this time. 2. Small amount of free fluid in the pelvis and right lower quadrant. Electronically Signed   By: Darliss Cheney M.D.   On: 12/12/2022 23:21    Anti-infectives: Anti-infectives (From  admission, onward)    Start     Dose/Rate Route Frequency Ordered Stop   12/13/22 1600  piperacillin-tazobactam (ZOSYN) IVPB 3.375 g        3.375 g 12.5 mL/hr over 240 Minutes Intravenous Every 8 hours 12/13/22 1502 12/18/22 1359   12/12/22 2345  cefTRIAXone (ROCEPHIN) 2 g in sodium chloride 0.9 % 100 mL IVPB       Placed in "And" Linked Group   2 g 200 mL/hr over 30 Minutes Intravenous  Once 12/12/22 2331 12/13/22 0025   12/12/22 2345  metroNIDAZOLE (FLAGYL) IVPB 500 mg       Placed in "And" Linked Group   500 mg 100 mL/hr over 60 Minutes Intravenous  Once 12/12/22 2331 12/13/22 0133        Assessment/Plan POD 1, s/p lap appy for perforated appendicitis, Dr. Luisa Hart, 8/29 -leave jp for 10-14 days -cont IV abx therapy today -cont soft diet -mobilize, pulm toilet -likely home tomorrow if continues to do well   FEN - soft, DC IVFs when taking adequate oral intake VTE - lovenox ID - zosyn, Dr. Luisa Hart wants 10 days total of abx therapy  HTN - home meds reordered    LOS: 1 day    Letha Cape , [facility] 12/14/2022, 9:44 AM Please see Amion for pager number during day hours 7:00am-4:30pm or 7:00am -11:30am on weekends

## 2022-12-14 NOTE — Discharge Instructions (Signed)

## 2022-12-15 MED ORDER — AMOXICILLIN-POT CLAVULANATE 875-125 MG PO TABS: 1.0000 | ORAL_TABLET | Freq: Two times a day (BID) | ORAL | 0 refills | Status: AC

## 2022-12-15 MED ORDER — TRAMADOL HCL 50 MG PO TABS: 50.0000 mg | ORAL_TABLET | Freq: Four times a day (QID) | ORAL | 0 refills | Status: AC | PRN

## 2022-12-15 MED ORDER — METHOCARBAMOL 750 MG PO TABS: 750.0000 mg | ORAL_TABLET | Freq: Four times a day (QID) | ORAL | 0 refills | Status: AC | PRN

## 2022-12-15 NOTE — Plan of Care (Signed)

## 2022-12-15 NOTE — Plan of Care (Signed)

## 2022-12-15 NOTE — Discharge Summary (Signed)
    Physician Discharge Summary   Patient ID: Olivia Pierce MRN: [id number] DOB/AGE: 07-07-1965 57 y.o.  Admit date: 12/12/2022  Discharge date: 12/15/2022  Discharge Diagnoses:  Principal Problem:   Perforated appendicitis   Discharged Condition: good  Hospital Course: Patient was admitted for observation following lap appendectomy by Dr. Luisa Hart.  Post op course was uncomplicated.  Pain was well controlled.  Tolerated diet.  Patient was prepared for discharge home on POD#2.  Consults: None  Treatments: surgery: lap appendectomy with drain placement  Discharge Exam: Blood pressure 108/65, pulse (!) 51, temperature 98.3 F (36.8 C), temperature source Oral, resp. rate 18, height 5\' 1"  (1.549 m), weight 77.1 kg, SpO2 97%. HEENT - clear Neck - soft Abd - soft without distension; wounds dry and intact; drain with serous output  Disposition: Home  Discharge Instructions     Diet - low sodium heart healthy   Complete by: As directed    Discharge wound care:   Complete by: As directed    Drain care as instructed and record output.  May shower and change dressing on drain to dry gauze if needed.   Increase activity slowly   Complete by: As directed          Follow-up Information     [facility] Follow up on 12/21/2022.   Specialty: General Surgery Why: 9:00am, arrive by 8:45 for check in. Nurse only visit for JP drain removal if you go home with this drain.  if not, this can be cancelled. Contact information: [address] [place] [address] [phone]         Reine Just, PA-C Follow up on 01/10/2023.   Specialty: General Surgery Why: 1:30pm, Arrive 30 minutes prior to your appointment time, Please bring your insurance card and photo ID Contact information: [id number] [place] [address] [facility] [place] [address] [phone]                 Darnell Level, MD [facility] Office:  [phone]   Signed: Darnell Level 12/15/2022, 9:20 AM

## 2022-12-15 NOTE — Progress Notes (Signed)
Discharge instructions (including medications) discussed with and copy provided to patient/caregiver  Patient given split gauze and tape for drain dressing at home.

## 2022-12-20 LAB — SURGICAL PATHOLOGY

## 2023-02-04 NOTE — Telephone Encounter (Signed)
Spoke with patient, she stated that she did received another Covid Vaccine since 09/2020. Patient would like to get the Covid Booster and the Flu Vaccine.

## 2023-02-11 ENCOUNTER — Ambulatory Visit (INDEPENDENT_AMBULATORY_CARE_PROVIDER_SITE_OTHER): Payer: Managed Care, Other (non HMO)

## 2023-02-11 DIAGNOSIS — Z23 Encounter for immunization: Secondary | ICD-10-CM | POA: Diagnosis not present

## 2023-02-11 NOTE — Progress Notes (Addendum)
   Covid-19 Vaccination Clinic  Name:  Olivia Pierce    MRN: [id number] DOB: May 29, 1965  02/11/2023  Ms. Olivia Pierce was observed post Covid-19 immunization for 15 minutes without incident. She was provided with Vaccine Information Sheet and instruction to access the V-Safe system.   Ms. Olivia Pierce was instructed to call 911 with any severe reactions post vaccine: Difficulty breathing  Swelling of face and throat  A fast heartbeat  A bad rash all over body  Dizziness and weakness

## 2023-02-11 NOTE — Telephone Encounter (Signed)
Patient received her Covid and Flu Vaccine today 02/11/2023.

## 2023-06-18 ENCOUNTER — Other Ambulatory Visit: Payer: Self-pay | Admitting: Cardiology

## 2023-06-18 DIAGNOSIS — Z1231 Encounter for screening mammogram for malignant neoplasm of breast: Secondary | ICD-10-CM

## 2023-06-28 ENCOUNTER — Telehealth: Payer: Self-pay | Admitting: Allergy & Immunology

## 2023-06-28 DIAGNOSIS — J310 Chronic rhinitis: Secondary | ICD-10-CM

## 2023-06-28 DIAGNOSIS — Z0000 Encounter for general adult medical examination without abnormal findings: Secondary | ICD-10-CM

## 2023-06-28 NOTE — Telephone Encounter (Signed)
 MMR titers order per patient request.

## 2023-06-29 LAB — MEASLES/MUMPS/RUBELLA IMMUNITY
MUMPS ABS, IGG: 261 [AU]/ml (ref >10.9)
RUBEOLA AB, IGG: >300 [AU]/ml (ref >16.4)
Rubella Antibodies, IGG: 6.11 {index} (ref >0.99)

## 2023-07-03 ENCOUNTER — Encounter: Payer: Self-pay | Admitting: Allergy & Immunology

## 2023-07-03 DIAGNOSIS — T781XXD Other adverse food reactions, not elsewhere classified, subsequent encounter: Secondary | ICD-10-CM

## 2023-08-14 ENCOUNTER — Encounter: Payer: Self-pay | Admitting: Radiology

## 2023-08-14 ENCOUNTER — Ambulatory Visit
Admission: RE | Admit: 2023-08-14 | Discharge: 2023-08-14 | Disposition: A | Discharge status: Home / Self Care | Source: Ambulatory Visit | Attending: Cardiology | Admitting: Cardiology

## 2023-08-14 DIAGNOSIS — Z1231 Encounter for screening mammogram for malignant neoplasm of breast: Secondary | ICD-10-CM

## 2023-10-31 NOTE — Telephone Encounter (Signed)
 Patient requested environmental allergy testing. She works for [employer] and gets free labs. Ordered the test.   Marty Shaggy, MD [facility] 

## 2023-10-31 NOTE — Addendum Note (Signed)
 Addended by: IVA MARTY SALTNESS on: 10/31/2023 09:30 AM   Modules accepted: Orders

## 2023-11-18 LAB — ALLERGENS W/COMP RFLX AREA 2
Alternaria Alternata IgE: <0.1 kU/L
Aspergillus Fumigatus IgE: <0.1 kU/L
Bermuda Grass IgE: <0.1 kU/L
Cedar, Mountain IgE: <0.1 kU/L
Cladosporium Herbarum IgE: <0.1 kU/L
Cockroach, German IgE: 0.33 kU/L — AB
Common Silver Birch IgE: <0.1 kU/L
Cottonwood IgE: <0.1 kU/L
D Farinae IgE: 0.12 kU/L — AB
D Pteronyssinus IgE: <0.1 kU/L
E001-IgE Cat Dander: <0.1 kU/L
E005-IgE Dog Dander: <0.1 kU/L
Elm, American IgE: <0.1 kU/L
IgE (Immunoglobulin E), Serum: 215 [IU]/mL (ref 6–495)
Johnson Grass IgE: <0.1 kU/L
Maple/Box Elder IgE: 10.3 kU/L — AB
Mouse Urine IgE: <0.1 kU/L
Oak, White IgE: <0.1 kU/L
Pecan, Hickory IgE: <0.1 kU/L
Penicillium Chrysogen IgE: <0.1 kU/L
Pigweed, Rough IgE: <0.1 kU/L
Ragweed, Short IgE: <0.1 kU/L
Sheep Sorrel IgE Qn: <0.1 kU/L
Timothy Grass IgE: <0.1 kU/L
White Mulberry IgE: <0.1 kU/L

## 2023-11-18 LAB — MILK COMPONENT PANEL
F076-IgE Alpha Lactalbumin: <0.1 kU/L
F077-IgE Beta Lactoglobulin: <0.1 kU/L
F078-IgE Casein: <0.1 kU/L

## 2023-11-18 LAB — ALLERGEN COCONUT IGE: Allergen Coconut IgE: <0.1 kU/L

## 2023-11-18 LAB — F020-IGE ALMOND: F020-IgE Almond: <0.1 kU/L

## 2023-11-19 ENCOUNTER — Ambulatory Visit: Payer: Self-pay | Admitting: Allergy & Immunology
